# Patient Record
Sex: Female | Born: 1975 | Race: Black or African American | Hispanic: No | Marital: Single | Smoking: Never smoker
Health system: Southern US, Community
[De-identification: ages and names within clinical notes are randomized; demographics above are authoritative.]

## PROBLEM LIST (undated history)

## (undated) DIAGNOSIS — I10 Essential (primary) hypertension: Secondary | ICD-10-CM

## (undated) DIAGNOSIS — F319 Bipolar disorder, unspecified: Secondary | ICD-10-CM

## (undated) DIAGNOSIS — F329 Major depressive disorder, single episode, unspecified: Secondary | ICD-10-CM

## (undated) DIAGNOSIS — E119 Type 2 diabetes mellitus without complications: Secondary | ICD-10-CM

## (undated) DIAGNOSIS — F32A Depression, unspecified: Secondary | ICD-10-CM

## (undated) DIAGNOSIS — K219 Gastro-esophageal reflux disease without esophagitis: Secondary | ICD-10-CM

## (undated) DIAGNOSIS — E785 Hyperlipidemia, unspecified: Secondary | ICD-10-CM

---
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
## 2011-04-14 ENCOUNTER — Inpatient Hospital Stay: Payer: Self-pay | Admitting: Psychiatry

## 2012-08-12 ENCOUNTER — Ambulatory Visit: Payer: Self-pay | Admitting: Emergency Medicine

## 2012-08-12 LAB — HEPATIC FUNCTION PANEL A (ARMC)
Alkaline Phosphatase: 77 U/L (ref 50–136)
Bilirubin, Direct: <0.1 mg/dL (ref 0.00–0.20)
Bilirubin,Total: 0.3 mg/dL (ref 0.2–1.0)
SGPT (ALT): 16 U/L (ref 12–78)

## 2012-08-18 ENCOUNTER — Ambulatory Visit: Payer: Self-pay | Admitting: Emergency Medicine

## 2012-08-18 LAB — HEMOGLOBIN: HGB: 12.1 g/dL (ref 12.0–16.0)

## 2012-08-19 LAB — CBC WITH DIFFERENTIAL/PLATELET
Basophil #: 0 10*3/uL (ref 0.0–0.1)
Basophil %: 0.3 %
HCT: 32.3 % — ABNORMAL LOW (ref 35.0–47.0)
HGB: 10.9 g/dL — ABNORMAL LOW (ref 12.0–16.0)
Lymphocyte #: 1.6 10*3/uL (ref 1.0–3.6)
Lymphocyte %: 16.7 %
MCH: 29.4 pg (ref 26.0–34.0)
MCV: 87 fL (ref 80–100)
Monocyte %: 7.6 %
RBC: 3.71 10*6/uL — ABNORMAL LOW (ref 3.80–5.20)
WBC: 9.5 10*3/uL (ref 3.6–11.0)

## 2012-08-19 LAB — HEMOGLOBIN: HGB: 10.9 g/dL — ABNORMAL LOW (ref 12.0–16.0)

## 2014-10-21 NOTE — Discharge Summary (Signed)
Dates of Admission and Diagnosis:  Date of Admission 18-Aug-2012   Date of Discharge 20-Aug-2012   Admitting Diagnosis acute cholysystitis   Final Diagnosis acute cholysystitis   Discharge Diagnosis 1 same    Chief Complaint/History of Present Illness pt had abdominal pain for a while ultrasound showed stones brought in for surgery   Routine Hem:  18-Feb-14 15:58   Hemoglobin (CBC) 12.1 (Result(s) reported on 18 Aug 2012 at 04:21PM.)  19-Feb-14 05:20   Hemoglobin (CBC)  10.9  WBC (CBC) 9.5  RBC (CBC)  3.71  Hematocrit (CBC)  32.3  Platelet Count (CBC) 152  MCV 87  MCH 29.4  MCHC 33.8  RDW 12.7  Neutrophil % 75.4  Lymphocyte % 16.7  Monocyte % 7.6  Eosinophil % 0.0  Basophil % 0.3  Neutrophil #  7.2  Lymphocyte # 1.6  Monocyte # 0.7  Eosinophil # 0.0  Basophil # 0.0 (Result(s) reported on 19 Aug 2012 at 06:11AM.)    09:19   Hemoglobin (CBC)  10.9 (Result(s) reported on 19 Aug 2012 at 09:43AM.)   Hospital Course:  Hospital Course pt was admitted for observation post op due to bleeding from liver but did very well and discharged on 08/20/12   Condition on Discharge Good   DISCHARGE INSTRUCTIONS HOME MEDS:  Medication Reconciliation: Patient's Home Medications at Discharge:     Medication Instructions  aspirin 325 mg oral tablet  1 tab(s) orally once a day   citalopram 20 mg oral tablet  1 tab(s) orally once a day   claritin 10 mg oral tablet  1 tab(s) orally once a day   lopressor 50 mg oral tablet  0.5 tab(s) orally 2 times a day   necon 0.5/35 35 mcg-0.5 mg oral tablet  1 tab(s) orally once a day   hydrochlorothiazide 25 mg oral tablet  1 tab(s) orally once a day   klor-con m20 20 meq oral tablet, extended release  1 tab(s) orally once a day   clozapine 200 mg oral tablet  1 tab(s) orally once a day (at bedtime)   clozapine 50 mg oral tablet  2 tab(s) orally once a day (in the morning)   pravachol 40 mg oral tablet  1 tab(s) orally once a day (at bedtime)    citalopram 20 mg oral tablet  1 tab(s) orally once a day   metoprolol tartrate 25 mg oral tablet  1 tab(s) orally 2 times a day   acetaminophen-oxycodone 325 mg-5 mg oral tablet  1 tab(s) orally every 4 hours, As needed, pain    PRESCRIPTIONS: PRINTED AND PLACED ON CHART   Physician's Instructions:  Home Health? No   Treatments None   Dressing Care Stitches have been used to close your incision.  Keep this area clean and dry.  Keep the bandage in place unless it becomes loose or soiled.   Home Oxygen? No   Diet Regular   Diet Consistency Regular Consistency   Activity Limitations No exertional activity   Referrals None   Return to Work after follow up visit with MD   Time frame for Follow Up Appointment 5 days   Other Comments measure the drainage daily.   Electronic Signatures: Vella Kohler (MD)  (Signed 20-Feb-14 13:23)  Authored: ADMISSION DATE AND DIAGNOSIS, CHIEF COMPLAINT/HPI, PERTINENT LABS, HOSPITAL COURSE, DISCHARGE INSTRUCTIONS HOME MEDS, PATIENT INSTRUCTIONS   Last Updated: 20-Feb-14 13:23 by Vella Kohler (MD)

## 2014-10-21 NOTE — Op Note (Signed)
PATIENT NAME:  Vanessa Garza, NATZKE MR#:  [id number] DATE OF BIRTH:  10-05-1975  DATE OF PROCEDURE:  08/18/2012  PREOPERATIVE DIAGNOSIS:  Acute cholecystitis.  POSTOPERATIVE DIAGNOSIS:  Acute cholecystitis.  PROCEDURE:  Laparoscopic cholecystectomy.  INDICATION FOR SURGERY: This is a patient who has been having pain in the right upper quadrant of the abdomen. She had acute cholecystitis and she was then brought in for surgery for lap chole today. Her liver functions are within normal limits.  ON THIS ADMISSION, THE PATIENT FINDINGS: 1.  Acutely chronic, very thickened gallbladder.  2.  Very difficult dissection from the liver bed.  3.  Stomach duodenum was stuck to the liver with a big adhesion which caused some bleeding from the site of the liver.   DESCRIPTION OF PROCEDURE: The patient was then brought to surgery. Under general anesthesia, the abdomen was then prepped and draped. A small incision was made above the umbilicus. The patient was very obese and after we went to the fascia, opened the fascia and put a Hasson trocar inside. The patient was so big that all of the trocar was in. After that initial inspection and CO2 was insufflated, the camera was then put in. Another trocar was put in the epigastric region area and another tube was put in the right upper quadrant of the abdomen. In the beginning, the gallbladder was thickened and was hard to grasp, although it was not hydrops but the stone was stuck over the cystic duct area. And since this patient was pretty big, we had a hard time lifting the gallbladder above the liver because of the size of the patient.  So one clamp was applied at the upper end of the gallbladder and the other clamp was applied to the lower end of the gallbladder. Dissection was done laterally to release the gallbladder off the liver. The patient had stomach and the duodenum stuck to the liver edge and there were a lot of adhesions which I released one by one, but the other  big release adhesion when we removed, it took away part of the liver capsule. The patient had a lot of bleeding from there, so we packed it with Surgicel. Multiple packs of Surgicel put in. After that, dissection was done over the cystic duct gallbladder junction very difficult dissection. First of all, the cystic artery was exposed. It was clipped and cut. The duct was exposed. The stone was stuck way at the [facility] pouch area and a space was made between the [facility] pouch and the cystic duct and it was then clipped and cut. The gallbladder was intrahepatic on top of it, so very difficult dissection. I had to put another trocar in the epigastric region; this was lower than the previous one to grab the gallbladder to lift it up. So finally after releasing the gallbladder up from the liver bed, hardly there was any new peritoneum, so there was some oozing from the liver bed also. After we took off the gallbladder, we really could not see any obvious bleeding. It looked like coming from the toned liver. So after packing and waiting and we removed the gallbladder from the upper part of the epigastric region and had very little cystic duct with it. After that irrigation was performed. It seemed to stop the bleeding with pressure due to Surgicel, then I put a drain in and put in the subhepatic space and brought out as a separate stab wound and from where one of the 5 mm trocar _____ #  10 drain out from that area. After again we saw the liver edge which looked okay and there was nothing accumulating on the back of the liver and the drain was in good position, the abdomen was then closed.  The _____ fascia was closed with interrupted 0 Vicryl sutures. Marcaine was injected and then all the areas were sutured with staples. The patient tolerated the procedure and sent to the recovery room in satisfactory condition.    ____________________________ Welford Roche Phylis Bougie, MD msh:ce D: 08/18/2012 15:54:18 ET T: 08/18/2012  16:07:29 ET JOB#: [id number]  cc: Cornellius Kropp S. Phylis Bougie, MD, <Dictator> Sharene Butters MD ELECTRONICALLY SIGNED 08/24/2012 8:12

## 2018-01-29 DIAGNOSIS — Z0000 Encounter for general adult medical examination without abnormal findings: Secondary | ICD-10-CM | POA: Diagnosis not present

## 2018-01-29 DIAGNOSIS — R5383 Other fatigue: Secondary | ICD-10-CM | POA: Diagnosis not present

## 2018-01-29 DIAGNOSIS — I10 Essential (primary) hypertension: Secondary | ICD-10-CM | POA: Diagnosis not present

## 2018-01-29 DIAGNOSIS — R7303 Prediabetes: Secondary | ICD-10-CM | POA: Diagnosis not present

## 2018-01-29 DIAGNOSIS — E559 Vitamin D deficiency, unspecified: Secondary | ICD-10-CM | POA: Diagnosis not present

## 2018-01-29 DIAGNOSIS — Z79899 Other long term (current) drug therapy: Secondary | ICD-10-CM | POA: Diagnosis not present

## 2018-01-29 DIAGNOSIS — Z1389 Encounter for screening for other disorder: Secondary | ICD-10-CM | POA: Diagnosis not present

## 2018-03-04 DIAGNOSIS — Z79899 Other long term (current) drug therapy: Secondary | ICD-10-CM | POA: Diagnosis not present

## 2018-03-04 DIAGNOSIS — R5383 Other fatigue: Secondary | ICD-10-CM | POA: Diagnosis not present

## 2018-03-04 DIAGNOSIS — R7303 Prediabetes: Secondary | ICD-10-CM | POA: Diagnosis not present

## 2018-03-04 DIAGNOSIS — R7989 Other specified abnormal findings of blood chemistry: Secondary | ICD-10-CM | POA: Diagnosis not present

## 2018-03-04 DIAGNOSIS — E7800 Pure hypercholesterolemia, unspecified: Secondary | ICD-10-CM | POA: Diagnosis not present

## 2018-03-04 DIAGNOSIS — N390 Urinary tract infection, site not specified: Secondary | ICD-10-CM | POA: Diagnosis not present

## 2018-03-04 DIAGNOSIS — R300 Dysuria: Secondary | ICD-10-CM | POA: Diagnosis not present

## 2018-03-04 DIAGNOSIS — I10 Essential (primary) hypertension: Secondary | ICD-10-CM | POA: Diagnosis not present

## 2018-03-04 DIAGNOSIS — Z23 Encounter for immunization: Secondary | ICD-10-CM | POA: Diagnosis not present

## 2018-03-04 DIAGNOSIS — E039 Hypothyroidism, unspecified: Secondary | ICD-10-CM | POA: Diagnosis not present

## 2018-03-04 DIAGNOSIS — E559 Vitamin D deficiency, unspecified: Secondary | ICD-10-CM | POA: Diagnosis not present

## 2018-03-04 DIAGNOSIS — Z118 Encounter for screening for other infectious and parasitic diseases: Secondary | ICD-10-CM | POA: Diagnosis not present

## 2018-03-04 DIAGNOSIS — Z112 Encounter for screening for other bacterial diseases: Secondary | ICD-10-CM | POA: Diagnosis not present

## 2018-03-05 DIAGNOSIS — M79661 Pain in right lower leg: Secondary | ICD-10-CM | POA: Diagnosis not present

## 2018-03-05 DIAGNOSIS — G603 Idiopathic progressive neuropathy: Secondary | ICD-10-CM | POA: Diagnosis not present

## 2018-04-07 DIAGNOSIS — L659 Nonscarring hair loss, unspecified: Secondary | ICD-10-CM | POA: Diagnosis not present

## 2018-04-07 DIAGNOSIS — R7989 Other specified abnormal findings of blood chemistry: Secondary | ICD-10-CM | POA: Diagnosis not present

## 2018-04-07 DIAGNOSIS — R221 Localized swelling, mass and lump, neck: Secondary | ICD-10-CM | POA: Diagnosis not present

## 2018-04-07 DIAGNOSIS — R5383 Other fatigue: Secondary | ICD-10-CM | POA: Diagnosis not present

## 2018-04-14 DIAGNOSIS — Z79899 Other long term (current) drug therapy: Secondary | ICD-10-CM | POA: Diagnosis not present

## 2018-04-28 DIAGNOSIS — M79671 Pain in right foot: Secondary | ICD-10-CM | POA: Diagnosis not present

## 2018-04-28 DIAGNOSIS — M79672 Pain in left foot: Secondary | ICD-10-CM | POA: Diagnosis not present

## 2018-05-05 DIAGNOSIS — M79661 Pain in right lower leg: Secondary | ICD-10-CM | POA: Diagnosis not present

## 2018-05-05 DIAGNOSIS — G603 Idiopathic progressive neuropathy: Secondary | ICD-10-CM | POA: Diagnosis not present

## 2018-05-08 ENCOUNTER — Other Ambulatory Visit: Payer: Self-pay

## 2018-05-08 NOTE — Patient Outreach (Signed)
[facility] [facility]) Care Management  05/08/2018  LONITA DEBES [birth year] [id number]   Medication Adherence call to Mrs. Gracelee Stemmler patient's telephone number is disconnected patient is due on Pravastatin 40 mg. Mrs. Zettlemoyer is showing past due under [place].   [place] Management Direct Dial [phone]  Fax [phone] [email or web address]

## 2018-05-26 DIAGNOSIS — Z79899 Other long term (current) drug therapy: Secondary | ICD-10-CM | POA: Diagnosis not present

## 2018-05-27 DIAGNOSIS — R5383 Other fatigue: Secondary | ICD-10-CM | POA: Diagnosis not present

## 2018-05-27 DIAGNOSIS — J069 Acute upper respiratory infection, unspecified: Secondary | ICD-10-CM | POA: Diagnosis not present

## 2018-05-27 DIAGNOSIS — R05 Cough: Secondary | ICD-10-CM | POA: Diagnosis not present

## 2018-06-02 DIAGNOSIS — E119 Type 2 diabetes mellitus without complications: Secondary | ICD-10-CM | POA: Diagnosis not present

## 2018-06-02 DIAGNOSIS — E7800 Pure hypercholesterolemia, unspecified: Secondary | ICD-10-CM | POA: Diagnosis not present

## 2018-06-02 DIAGNOSIS — I10 Essential (primary) hypertension: Secondary | ICD-10-CM | POA: Diagnosis not present

## 2018-06-02 DIAGNOSIS — E559 Vitamin D deficiency, unspecified: Secondary | ICD-10-CM | POA: Diagnosis not present

## 2018-06-02 DIAGNOSIS — R7303 Prediabetes: Secondary | ICD-10-CM | POA: Diagnosis not present

## 2018-06-17 ENCOUNTER — Other Ambulatory Visit: Payer: Self-pay

## 2018-06-17 NOTE — Patient Outreach (Signed)
[facility]) Care Management  06/17/2018  LUDIVINA GUYMON [birth year] [id number]   Medication Adherence call to Mrs. Tarina Volk patient's telephone number is disconnected patient is due on Pravastatin 40, [place] said patient pick up on Dec.12 and deliver to the patient she received a pill pack every month. Mrs. Berland is showing past due under [place].   [place] Management Direct Dial [phone]  Fax [phone] [email or web address]

## 2018-06-18 DIAGNOSIS — R7303 Prediabetes: Secondary | ICD-10-CM | POA: Diagnosis not present

## 2018-06-18 DIAGNOSIS — E876 Hypokalemia: Secondary | ICD-10-CM | POA: Diagnosis not present

## 2018-06-18 DIAGNOSIS — I10 Essential (primary) hypertension: Secondary | ICD-10-CM | POA: Diagnosis not present

## 2018-06-29 DIAGNOSIS — Z79899 Other long term (current) drug therapy: Secondary | ICD-10-CM | POA: Diagnosis not present

## 2018-07-13 DIAGNOSIS — Z79899 Other long term (current) drug therapy: Secondary | ICD-10-CM | POA: Diagnosis not present

## 2018-08-28 DIAGNOSIS — Z1231 Encounter for screening mammogram for malignant neoplasm of breast: Secondary | ICD-10-CM | POA: Diagnosis not present

## 2018-09-15 DIAGNOSIS — E7800 Pure hypercholesterolemia, unspecified: Secondary | ICD-10-CM | POA: Diagnosis not present

## 2018-09-15 DIAGNOSIS — R7303 Prediabetes: Secondary | ICD-10-CM | POA: Diagnosis not present

## 2018-09-15 DIAGNOSIS — E559 Vitamin D deficiency, unspecified: Secondary | ICD-10-CM | POA: Diagnosis not present

## 2018-09-15 DIAGNOSIS — E119 Type 2 diabetes mellitus without complications: Secondary | ICD-10-CM | POA: Diagnosis not present

## 2018-09-15 DIAGNOSIS — I10 Essential (primary) hypertension: Secondary | ICD-10-CM | POA: Diagnosis not present

## 2018-09-17 ENCOUNTER — Other Ambulatory Visit: Payer: Self-pay

## 2018-09-17 NOTE — Patient Outreach (Signed)
[place] [facility]) Care Management  09/17/2018  Vanessa Garza 20-May-1976 [id number]   Medication Adherence call to Vanessa Garza patient is recieving all his medication from [facility] they pill pack and deliver to him, patient is due on Pravastatin 40 mg under [place].    [place] Management Direct Dial [phone]  Fax [phone] [name]

## 2018-10-30 DIAGNOSIS — Z79899 Other long term (current) drug therapy: Secondary | ICD-10-CM | POA: Diagnosis not present

## 2018-11-16 ENCOUNTER — Other Ambulatory Visit: Payer: Self-pay

## 2018-11-16 NOTE — Patient Outreach (Signed)
[place] [facility]) Care Management  11/16/2018  Vanessa Garza [birth year] [id number]   Medication Adherence call to Vanessa Garza patient did not answer patient is due on Metformin 500 mg and Pravastatin 20 mg under [place].   [place] Management Direct Dial [phone]  Fax [phone] [name]

## 2018-11-30 DIAGNOSIS — Z79899 Other long term (current) drug therapy: Secondary | ICD-10-CM | POA: Diagnosis not present

## 2018-12-08 ENCOUNTER — Other Ambulatory Visit: Payer: Self-pay

## 2018-12-08 NOTE — Patient Outreach (Signed)
[place] [facility]) Care Management  12/08/2018  Vanessa Garza [birth year] [id number]   Medication Adherence call to Mrs. Carey Lafon Telephone call to Patient regarding Medication Adherence unable to reach patient. Mrs Osmanovic is showing past due under [place].   [place] Management Direct Dial [phone]  Fax [phone] [email or web address]

## 2018-12-29 DIAGNOSIS — E7800 Pure hypercholesterolemia, unspecified: Secondary | ICD-10-CM | POA: Diagnosis not present

## 2018-12-29 DIAGNOSIS — E119 Type 2 diabetes mellitus without complications: Secondary | ICD-10-CM | POA: Diagnosis not present

## 2018-12-29 DIAGNOSIS — I10 Essential (primary) hypertension: Secondary | ICD-10-CM | POA: Diagnosis not present

## 2018-12-29 DIAGNOSIS — E559 Vitamin D deficiency, unspecified: Secondary | ICD-10-CM | POA: Diagnosis not present

## 2018-12-29 DIAGNOSIS — R221 Localized swelling, mass and lump, neck: Secondary | ICD-10-CM | POA: Diagnosis not present

## 2018-12-29 DIAGNOSIS — K219 Gastro-esophageal reflux disease without esophagitis: Secondary | ICD-10-CM | POA: Diagnosis not present

## 2018-12-29 DIAGNOSIS — N200 Calculus of kidney: Secondary | ICD-10-CM | POA: Diagnosis not present

## 2018-12-30 ENCOUNTER — Other Ambulatory Visit: Payer: Self-pay

## 2018-12-30 NOTE — Patient Outreach (Signed)
[place] [facility] [facility]) Care Management  12/30/2018  Vanessa Garza 1975/10/02 [id number]   Medication Adherence call to Vanessa Garza patient did not answer patient is due on Metformin 500 mg and Pravastatin 40 mg under Vanessa Garza.   [facility]essa Garza Management Direct Dial [phone]  Fax [phone] [email or web address]

## 2019-02-01 ENCOUNTER — Other Ambulatory Visit: Payer: Self-pay

## 2019-02-01 NOTE — Patient Outreach (Signed)
[place] [facility]) Care Management  02/01/2019  Vanessa Garza Mar 26, 1976 [id number]   Medication Adherence call to Vanessa Garza patient has a disconnected number patient is past due under [place].   [place] Management Direct Dial [phone]  Fax [phone] [email or web address]

## 2019-02-14 ENCOUNTER — Encounter: Payer: Self-pay | Admitting: Emergency Medicine

## 2019-02-14 ENCOUNTER — Emergency Department
Admission: EM | Admit: 2019-02-14 | Discharge: 2019-02-14 | Disposition: A | Discharge status: Home / Self Care | Payer: Medicare Other | Attending: Emergency Medicine | Admitting: Emergency Medicine

## 2019-02-14 ENCOUNTER — Other Ambulatory Visit: Payer: Self-pay

## 2019-02-14 DIAGNOSIS — Y9389 Activity, other specified: Secondary | ICD-10-CM | POA: Diagnosis not present

## 2019-02-14 DIAGNOSIS — W5551XA Bitten by raccoon, initial encounter: Secondary | ICD-10-CM | POA: Diagnosis not present

## 2019-02-14 DIAGNOSIS — Z23 Encounter for immunization: Secondary | ICD-10-CM | POA: Insufficient documentation

## 2019-02-14 DIAGNOSIS — E785 Hyperlipidemia, unspecified: Secondary | ICD-10-CM | POA: Insufficient documentation

## 2019-02-14 DIAGNOSIS — S61452A Open bite of left hand, initial encounter: Secondary | ICD-10-CM | POA: Insufficient documentation

## 2019-02-14 DIAGNOSIS — Z2914 Encounter for prophylactic rabies immune globin: Secondary | ICD-10-CM | POA: Insufficient documentation

## 2019-02-14 DIAGNOSIS — Z203 Contact with and (suspected) exposure to rabies: Secondary | ICD-10-CM | POA: Insufficient documentation

## 2019-02-14 DIAGNOSIS — E119 Type 2 diabetes mellitus without complications: Secondary | ICD-10-CM | POA: Insufficient documentation

## 2019-02-14 DIAGNOSIS — Y999 Unspecified external cause status: Secondary | ICD-10-CM | POA: Diagnosis not present

## 2019-02-14 DIAGNOSIS — I10 Essential (primary) hypertension: Secondary | ICD-10-CM | POA: Insufficient documentation

## 2019-02-14 DIAGNOSIS — Y929 Unspecified place or not applicable: Secondary | ICD-10-CM | POA: Insufficient documentation

## 2019-02-14 HISTORY — DX: Bipolar disorder, unspecified: F31.9

## 2019-02-14 HISTORY — DX: Essential (primary) hypertension: I10

## 2019-02-14 HISTORY — DX: Gastro-esophageal reflux disease without esophagitis: K21.9

## 2019-02-14 HISTORY — DX: Hyperlipidemia, unspecified: E78.5

## 2019-02-14 HISTORY — DX: Type 2 diabetes mellitus without complications: E11.9

## 2019-02-14 HISTORY — DX: Depression, unspecified: F32.A

## 2019-02-14 MED ORDER — RABIES IMMUNE GLOBULIN 150 UNIT/ML IM INJ
20.0000 [IU]/kg | INJECTION | Freq: Once | INTRAMUSCULAR | Status: AC
  Administered 2019-02-14: 2400 [IU] via INTRAMUSCULAR
  Filled 2019-02-14: qty 16

## 2019-02-14 MED ORDER — RABIES VACCINE, PCEC IM SUSR
1.0000 mL | Freq: Once | INTRAMUSCULAR | Status: AC
Start: 2019-02-14 — End: 2019-02-14
  Administered 2019-02-14: 1 mL via INTRAMUSCULAR
  Filled 2019-02-14: qty 1

## 2019-02-14 MED ORDER — AMOXICILLIN-POT CLAVULANATE 875-125 MG PO TABS: 1.0000 | ORAL_TABLET | Freq: Two times a day (BID) | ORAL | 0 refills | Status: DC

## 2019-02-14 MED ORDER — TETANUS-DIPHTH-ACELL PERTUSSIS 5-2.5-18.5 LF-MCG/0.5 IM SUSP
0.5000 mL | Freq: Once | INTRAMUSCULAR | Status: AC
  Administered 2019-02-14: 0.5 mL via INTRAMUSCULAR
  Filled 2019-02-14: qty 0.5

## 2019-02-14 NOTE — ED Notes (Signed)
Pt states raccoon tried to bite her and has scratches/ bite marks on her left hand. Also needs a tetanus update.

## 2019-02-14 NOTE — ED Notes (Signed)
Pt states that she was bitten by a racoon on the left hand. Pt is in NAD.

## 2019-02-14 NOTE — ED Provider Notes (Signed)
[facility] Emergency Department Provider Note  ____________________________________________  Time seen: Approximately 9:31 PM  I have reviewed the triage vital signs and the nursing notes.   HISTORY  Chief Complaint Animal Bite    HPI Vanessa Garza is a 43 y.o. female who presents the emergency department complaining of a raccoon bite to the  left hand.  Patient reports that she picked up a wild raccoon and it bit her.  Patient was in a group home and is accompanied by the caregiver.  They did not have the raccoon in custody.  Patient is out of date for her tetanus shot.  Other than raccoon bite to the left hand, no other complaints.        Past Medical History:  Diagnosis Date  . Bipolar 1 disorder ([place])   . Depression   . Diabetes mellitus without complication ([place])   . GERD (gastroesophageal reflux disease)   . Hyperlipemia   . Hypertension     There are no active problems to display for this patient.   History reviewed. No pertinent surgical history.  Prior to Admission medications   Medication Sig Start Date End Date Taking? Authorizing Provider  amoxicillin-clavulanate (AUGMENTIN) 875-125 MG tablet Take 1 tablet by mouth 2 (two) times daily. 02/14/19   Cuthriell, Charline Bills, PA-C    Allergies Patient has no known allergies.  No family history on file.  Social History Social History   Tobacco Use  . Smoking status: Never Smoker  . Smokeless tobacco: Never Used  Substance Use Topics  . Alcohol use: Not Currently  . Drug use: Not Currently     Review of Systems  Constitutional: No fever/chills Eyes: No visual changes. No discharge ENT: No upper respiratory complaints. Cardiovascular: no chest pain. Respiratory: no cough. No SOB. Gastrointestinal: No abdominal pain.  No nausea, no vomiting.  Musculoskeletal: Raccoon bite to the left hand Skin: Negative for rash, abrasions, lacerations, ecchymosis. Neurological: Negative for  headaches, focal weakness or numbness. 10-point ROS otherwise negative.  ____________________________________________   PHYSICAL EXAM:  VITAL SIGNS: ED Triage Vitals  Enc Vitals Group     BP 02/14/19 1850 (!) 144/96     Pulse Rate 02/14/19 1850 (!) 103     Resp 02/14/19 1850 16     Temp 02/14/19 1850 99.1 F (37.3 C)     Temp Source 02/14/19 1850 Oral     SpO2 02/14/19 1850 100 %     Weight --      Height --      Head Circumference --      Peak Flow --      Pain Score 02/14/19 1847 0     Pain Loc --      Pain Edu? --      Excl. in [place]? --      Constitutional: Alert and oriented. Well appearing and in no acute distress. Eyes: Conjunctivae are normal. PERRL. EOMI. Head: Atraumatic. Neck: No stridor.    Cardiovascular: Normal rate, regular rhythm. Normal S1 and S2.  Good peripheral circulation. Respiratory: Normal respiratory effort without tachypnea or retractions. Lungs CTAB. Good air entry to the bases with no decreased or absent breath sounds. Musculoskeletal: Full range of motion to all extremities. No gross deformities appreciated.  Visualization of the left hand reveals multiple punctate lesions to the dorsal and palmar aspect of the hand between the second and third digit.  No active bleeding.  No visible foreign body.  Full range of motion all  digits.  Sensation and capillary refill intact all digits. Neurologic:  Normal speech and language. No gross focal neurologic deficits are appreciated.  Skin:  Skin is warm, dry and intact. No rash noted. Psychiatric: Mood and affect are normal. Speech and behavior are normal. Patient exhibits appropriate insight and judgement.   ____________________________________________   LABS (all labs ordered are listed, but only abnormal results are displayed)  Labs Reviewed - No data to display ____________________________________________  EKG   ____________________________________________  RADIOLOGY   No results  found.  ____________________________________________    PROCEDURES  Procedure(s) performed:    Procedures    Medications  rabies vaccine (RABAVERT) injection 1 mL (1 mL Intramuscular Given 02/14/19 2226)  rabies immune globulin (HYPERAB/KEDRAB) injection 2,400 Units (2,400 Units Intramuscular Given 02/14/19 2231)  Tdap (BOOSTRIX) injection 0.5 mL (0.5 mLs Intramuscular Given 02/14/19 2225)     ____________________________________________   INITIAL IMPRESSION / ASSESSMENT AND PLAN / ED COURSE  Pertinent labs & imaging results that were available during my care of the patient were reviewed by me and considered in my medical decision making (see chart for details).  Review of the  CSRS was performed in accordance of the [place] prior to dispensing any controlled drugs.           Patient's diagnosis is consistent with animal bite to the left hand.  Patient presented to the emergency department with caregiver for complaint of raccoon bite to the left hand.  Patient has small superficial punctate lesions consistent with animal bite to the left hand.  No active bleeding or foreign body.  Full range of motion to all digits.  Given the nature of injury with bite by a raccoon, patient will be started on the rabies series.  Rabies vaccine and immunoglobin administered today.  Tetanus shot updated patient will be placed on antibiotics.  Follow-up in 3 days for repeat rabies vaccine. Patient is given ED precautions to return to the ED for any worsening or new symptoms.     ____________________________________________  FINAL CLINICAL IMPRESSION(S) / ED DIAGNOSES  Final diagnoses:  Raccoon bite, initial encounter  Need for rabies vaccination      NEW MEDICATIONS STARTED DURING THIS VISIT:  ED Discharge Orders         Ordered    amoxicillin-clavulanate (AUGMENTIN) 875-125 MG tablet  2 times daily     02/14/19 2235              This chart was dictated using voice  recognition software/Dragon. Despite best efforts to proofread, errors can occur which can change the meaning. Any change was purely unintentional.    Darletta Moll, PA-C 02/14/19 2235    Earleen Newport, MD 02/14/19 [phone]

## 2019-02-14 NOTE — ED Triage Notes (Signed)
Pt to ED via POV with caregiver for racoon bite to the right hand. Pt is in NAD. No bleeding noted.

## 2019-02-17 ENCOUNTER — Other Ambulatory Visit: Payer: Self-pay

## 2019-02-17 ENCOUNTER — Ambulatory Visit
Admission: EM | Admit: 2019-02-17 | Discharge: 2019-02-17 | Disposition: A | Discharge status: Home / Self Care | Payer: Medicare Other | Attending: Family Medicine | Admitting: Family Medicine

## 2019-02-17 DIAGNOSIS — Z203 Contact with and (suspected) exposure to rabies: Secondary | ICD-10-CM

## 2019-02-17 MED ORDER — RABIES VACCINE, PCEC IM SUSR
1.0000 mL | Freq: Once | INTRAMUSCULAR | Status: AC
  Administered 2019-02-17: 16:00:00 1 mL via INTRAMUSCULAR

## 2019-02-17 NOTE — Discharge Instructions (Signed)
Please Return for Day 7 Rabies Vaccine on August 23rd. We are open 8am-4pm on Sunday.

## 2019-02-17 NOTE — ED Triage Notes (Signed)
Patient is here for Day 3 rabies vaccine. Initial vaccines given in [place] ED. No issues with previous injections.

## 2019-02-22 ENCOUNTER — Ambulatory Visit
Admission: EM | Admit: 2019-02-22 | Discharge: 2019-02-22 | Disposition: A | Discharge status: Home / Self Care | Payer: Medicare Other | Attending: Family Medicine | Admitting: Family Medicine

## 2019-02-22 ENCOUNTER — Other Ambulatory Visit: Payer: Self-pay

## 2019-02-22 DIAGNOSIS — Z203 Contact with and (suspected) exposure to rabies: Secondary | ICD-10-CM | POA: Diagnosis not present

## 2019-02-22 MED ORDER — RABIES VACCINE, PCEC IM SUSR
1.0000 mL | Freq: Once | INTRAMUSCULAR | Status: AC
  Administered 2019-02-22: 09:00:00 1 mL via INTRAMUSCULAR

## 2019-02-22 NOTE — ED Triage Notes (Signed)
Patient is here for Day 7 Rabies vaccine (1 day late). Tolerated injection well last time.

## 2019-02-22 NOTE — Discharge Instructions (Signed)
Patient is to return on Monday 03/01/2019 for Day 14 of Rabies Vaccine. We are open 8am-8pm on this day.

## 2019-03-01 ENCOUNTER — Ambulatory Visit
Admission: EM | Admit: 2019-03-01 | Discharge: 2019-03-01 | Disposition: A | Discharge status: Home / Self Care | Payer: Medicare Other | Attending: Family Medicine | Admitting: Family Medicine

## 2019-03-01 DIAGNOSIS — Z203 Contact with and (suspected) exposure to rabies: Secondary | ICD-10-CM

## 2019-03-01 MED ORDER — RABIES VACCINE, PCEC IM SUSR
1.0000 mL | Freq: Once | INTRAMUSCULAR | Status: AC
  Administered 2019-03-01: 1 mL via INTRAMUSCULAR

## 2019-03-01 NOTE — ED Triage Notes (Signed)
Pt here for her Day 14 rabies vaccine. Tolerated well

## 2019-03-01 NOTE — ED Notes (Signed)
After obtaining informed consent, the immunization is given by Carmelina Paddock, CMA.  L deltoid. Pt tolerated well.

## 2019-03-02 ENCOUNTER — Other Ambulatory Visit: Payer: Self-pay

## 2019-03-02 NOTE — Patient Outreach (Signed)
[place] [facility]) Care Management  03/02/2019  Vanessa Garza Mar 13, 1976 [id number]   Medication Adherence call to Vanessa Garza  Patients telephone number belongs to someone else person provide with a different number [phone] but is disconnected patient is showing past due on Pravastatin 40 mg and Metformin 500 mg under [name].   [place] Management Direct Dial [phone]  Fax [phone] Dandy [email or web address]

## 2019-04-05 ENCOUNTER — Other Ambulatory Visit: Payer: Self-pay

## 2019-04-05 DIAGNOSIS — E7800 Pure hypercholesterolemia, unspecified: Secondary | ICD-10-CM | POA: Diagnosis not present

## 2019-04-05 DIAGNOSIS — I10 Essential (primary) hypertension: Secondary | ICD-10-CM | POA: Diagnosis not present

## 2019-04-05 DIAGNOSIS — Z23 Encounter for immunization: Secondary | ICD-10-CM | POA: Diagnosis not present

## 2019-04-05 DIAGNOSIS — E785 Hyperlipidemia, unspecified: Secondary | ICD-10-CM | POA: Diagnosis not present

## 2019-04-05 DIAGNOSIS — Z0000 Encounter for general adult medical examination without abnormal findings: Secondary | ICD-10-CM | POA: Diagnosis not present

## 2019-04-05 DIAGNOSIS — E559 Vitamin D deficiency, unspecified: Secondary | ICD-10-CM | POA: Diagnosis not present

## 2019-04-05 DIAGNOSIS — Z1389 Encounter for screening for other disorder: Secondary | ICD-10-CM | POA: Diagnosis not present

## 2019-04-05 DIAGNOSIS — Z8249 Family history of ischemic heart disease and other diseases of the circulatory system: Secondary | ICD-10-CM | POA: Diagnosis not present

## 2019-04-05 DIAGNOSIS — I2510 Atherosclerotic heart disease of native coronary artery without angina pectoris: Secondary | ICD-10-CM | POA: Diagnosis not present

## 2019-04-05 DIAGNOSIS — D649 Anemia, unspecified: Secondary | ICD-10-CM | POA: Diagnosis not present

## 2019-04-05 DIAGNOSIS — E119 Type 2 diabetes mellitus without complications: Secondary | ICD-10-CM | POA: Diagnosis not present

## 2019-04-05 DIAGNOSIS — R7989 Other specified abnormal findings of blood chemistry: Secondary | ICD-10-CM | POA: Diagnosis not present

## 2019-04-05 NOTE — Patient Outreach (Signed)
[facility] [facility]) Care Management  04/05/2019  [name] 11-08-1975 [id number]   Medication Adherence call to Mrs. Vanessa Garza HIPPA Compliant Voice message left with a call back number. [name] is showing past due on Pravastatin 40 mg and Metformin 500 mg under [place].   Vanessa Garza [phone]  Fax [phone] [name]

## 2019-05-10 ENCOUNTER — Other Ambulatory Visit: Payer: Self-pay

## 2019-05-10 NOTE — Patient Outreach (Signed)
[place] [facility]) Care Management  05/10/2019  JESSIECA ZEMAN April 24, 1976 [id number]   Medication Adherence call to Mrs. Deshundra Formby HIPPA Compliant Voice message left with a call back number. Mrs. Riveron is showing past due on Pravastatin 40 mg and Metformin 50 mg under [place].   [place] Management Direct Dial [phone]  Fax [phone] [email or web address] [email or web address]

## 2019-06-01 ENCOUNTER — Other Ambulatory Visit: Payer: Self-pay

## 2019-06-01 NOTE — Patient Outreach (Signed)
[place] Care Management  06/01/2019  Vanessa Garza [birth year] [id number]   Medication Adherence call to Vanessa Garza HIPPA Compliant Voice message left with a call back number. Vanessa Garza is showing past due on Pravastatin 40 mg and Metformin 500 mg under [place].   [place] Management Direct Dial [phone]  Fax [phone] Vanessa [email or web address]

## 2019-06-29 DIAGNOSIS — E119 Type 2 diabetes mellitus without complications: Secondary | ICD-10-CM | POA: Diagnosis not present

## 2019-06-29 DIAGNOSIS — I6529 Occlusion and stenosis of unspecified carotid artery: Secondary | ICD-10-CM | POA: Diagnosis not present

## 2019-06-29 DIAGNOSIS — E876 Hypokalemia: Secondary | ICD-10-CM | POA: Diagnosis not present

## 2019-06-29 DIAGNOSIS — I361 Nonrheumatic tricuspid (valve) insufficiency: Secondary | ICD-10-CM | POA: Diagnosis not present

## 2019-07-08 DIAGNOSIS — E039 Hypothyroidism, unspecified: Secondary | ICD-10-CM | POA: Diagnosis not present

## 2019-07-08 DIAGNOSIS — E119 Type 2 diabetes mellitus without complications: Secondary | ICD-10-CM | POA: Diagnosis not present

## 2019-07-08 DIAGNOSIS — E118 Type 2 diabetes mellitus with unspecified complications: Secondary | ICD-10-CM | POA: Diagnosis not present

## 2019-07-08 DIAGNOSIS — E876 Hypokalemia: Secondary | ICD-10-CM | POA: Diagnosis not present

## 2019-07-08 DIAGNOSIS — K219 Gastro-esophageal reflux disease without esophagitis: Secondary | ICD-10-CM | POA: Diagnosis not present

## 2019-07-08 DIAGNOSIS — I10 Essential (primary) hypertension: Secondary | ICD-10-CM | POA: Diagnosis not present

## 2019-07-08 DIAGNOSIS — E7800 Pure hypercholesterolemia, unspecified: Secondary | ICD-10-CM | POA: Diagnosis not present

## 2019-08-02 ENCOUNTER — Other Ambulatory Visit: Payer: Self-pay

## 2019-08-02 NOTE — Patient Outreach (Signed)
[place] [facility]) Care Management  08/02/2019  Vanessa Garza 11-20-1975 [id number]   Medication Adherence call to [name] patient is showing past due on Metformin 500 mg,the  Telephone number listed here belongs to someone else the person that answer gave a different number [phone]  to get in touch with patient left a message for patient to call back. [name] is showing past due on Metformin 500 mg under [name].   [place] Management Direct Dial [phone]  Fax [phone] Seydina [email or web address]

## 2019-08-31 ENCOUNTER — Other Ambulatory Visit: Payer: Self-pay

## 2019-08-31 NOTE — Patient Outreach (Signed)
[place] [facility]) Care Management  08/31/2019  Vanessa Garza Feb 07, 1976 [id number]   Medication Adherence call to Vanessa Garza HIPPA Compliant Voice message left with a call back number. Vanessa Garza is showing past due on Pravastatin 40 mg under [place].   [place] Management Direct Dial [phone]  Fax [phone] Vanessa [email or web address]

## 2019-09-07 DIAGNOSIS — E119 Type 2 diabetes mellitus without complications: Secondary | ICD-10-CM | POA: Diagnosis not present

## 2019-09-07 DIAGNOSIS — Z79899 Other long term (current) drug therapy: Secondary | ICD-10-CM | POA: Diagnosis not present

## 2019-09-07 DIAGNOSIS — L84 Corns and callosities: Secondary | ICD-10-CM | POA: Diagnosis not present

## 2019-09-07 DIAGNOSIS — I10 Essential (primary) hypertension: Secondary | ICD-10-CM | POA: Diagnosis not present

## 2019-09-16 DIAGNOSIS — M79661 Pain in right lower leg: Secondary | ICD-10-CM | POA: Diagnosis not present

## 2019-09-16 DIAGNOSIS — G603 Idiopathic progressive neuropathy: Secondary | ICD-10-CM | POA: Diagnosis not present

## 2019-09-23 ENCOUNTER — Ambulatory Visit: Payer: Medicare Other | Admitting: Podiatry

## 2019-10-11 DIAGNOSIS — R9431 Abnormal electrocardiogram [ECG] [EKG]: Secondary | ICD-10-CM | POA: Diagnosis not present

## 2019-10-11 DIAGNOSIS — E876 Hypokalemia: Secondary | ICD-10-CM | POA: Diagnosis not present

## 2019-10-11 DIAGNOSIS — Z118 Encounter for screening for other infectious and parasitic diseases: Secondary | ICD-10-CM | POA: Diagnosis not present

## 2019-10-11 DIAGNOSIS — Z79899 Other long term (current) drug therapy: Secondary | ICD-10-CM | POA: Diagnosis not present

## 2019-10-11 DIAGNOSIS — E7800 Pure hypercholesterolemia, unspecified: Secondary | ICD-10-CM | POA: Diagnosis not present

## 2019-10-11 DIAGNOSIS — Z112 Encounter for screening for other bacterial diseases: Secondary | ICD-10-CM | POA: Diagnosis not present

## 2019-10-11 DIAGNOSIS — R5383 Other fatigue: Secondary | ICD-10-CM | POA: Diagnosis not present

## 2019-10-11 DIAGNOSIS — E785 Hyperlipidemia, unspecified: Secondary | ICD-10-CM | POA: Diagnosis not present

## 2019-10-11 DIAGNOSIS — R300 Dysuria: Secondary | ICD-10-CM | POA: Diagnosis not present

## 2019-10-11 DIAGNOSIS — E119 Type 2 diabetes mellitus without complications: Secondary | ICD-10-CM | POA: Diagnosis not present

## 2019-10-11 DIAGNOSIS — I10 Essential (primary) hypertension: Secondary | ICD-10-CM | POA: Diagnosis not present

## 2019-10-11 DIAGNOSIS — N390 Urinary tract infection, site not specified: Secondary | ICD-10-CM | POA: Diagnosis not present

## 2019-10-11 DIAGNOSIS — R7303 Prediabetes: Secondary | ICD-10-CM | POA: Diagnosis not present

## 2019-10-11 DIAGNOSIS — E559 Vitamin D deficiency, unspecified: Secondary | ICD-10-CM | POA: Diagnosis not present

## 2019-11-24 ENCOUNTER — Observation Stay
Admission: EM | Admit: 2019-11-24 | Discharge: 2019-11-25 | Disposition: A | Discharge status: Home / Self Care | Payer: Medicare Other | Attending: Internal Medicine | Admitting: Internal Medicine

## 2019-11-24 ENCOUNTER — Emergency Department: Payer: Medicare Other

## 2019-11-24 ENCOUNTER — Encounter: Payer: Self-pay | Admitting: Emergency Medicine

## 2019-11-24 ENCOUNTER — Other Ambulatory Visit: Payer: Self-pay

## 2019-11-24 ENCOUNTER — Observation Stay: Payer: Medicare Other

## 2019-11-24 DIAGNOSIS — Z03818 Encounter for observation for suspected exposure to other biological agents ruled out: Secondary | ICD-10-CM | POA: Diagnosis not present

## 2019-11-24 DIAGNOSIS — E876 Hypokalemia: Secondary | ICD-10-CM | POA: Diagnosis not present

## 2019-11-24 DIAGNOSIS — R569 Unspecified convulsions: Principal | ICD-10-CM

## 2019-11-24 DIAGNOSIS — G92 Toxic encephalopathy: Secondary | ICD-10-CM | POA: Insufficient documentation

## 2019-11-24 DIAGNOSIS — R404 Transient alteration of awareness: Secondary | ICD-10-CM | POA: Diagnosis not present

## 2019-11-24 DIAGNOSIS — E119 Type 2 diabetes mellitus without complications: Secondary | ICD-10-CM | POA: Diagnosis not present

## 2019-11-24 DIAGNOSIS — E785 Hyperlipidemia, unspecified: Secondary | ICD-10-CM | POA: Diagnosis not present

## 2019-11-24 DIAGNOSIS — Z7984 Long term (current) use of oral hypoglycemic drugs: Secondary | ICD-10-CM | POA: Insufficient documentation

## 2019-11-24 DIAGNOSIS — F319 Bipolar disorder, unspecified: Secondary | ICD-10-CM | POA: Diagnosis not present

## 2019-11-24 DIAGNOSIS — R0689 Other abnormalities of breathing: Secondary | ICD-10-CM | POA: Diagnosis not present

## 2019-11-24 DIAGNOSIS — I10 Essential (primary) hypertension: Secondary | ICD-10-CM | POA: Insufficient documentation

## 2019-11-24 DIAGNOSIS — R9431 Abnormal electrocardiogram [ECG] [EKG]: Secondary | ICD-10-CM | POA: Diagnosis not present

## 2019-11-24 DIAGNOSIS — G9340 Encephalopathy, unspecified: Secondary | ICD-10-CM | POA: Diagnosis not present

## 2019-11-24 DIAGNOSIS — G928 Other toxic encephalopathy: Secondary | ICD-10-CM | POA: Diagnosis present

## 2019-11-24 DIAGNOSIS — Z79899 Other long term (current) drug therapy: Secondary | ICD-10-CM | POA: Insufficient documentation

## 2019-11-24 DIAGNOSIS — E669 Obesity, unspecified: Secondary | ICD-10-CM | POA: Insufficient documentation

## 2019-11-24 DIAGNOSIS — K219 Gastro-esophageal reflux disease without esophagitis: Secondary | ICD-10-CM | POA: Diagnosis not present

## 2019-11-24 DIAGNOSIS — R0902 Hypoxemia: Secondary | ICD-10-CM | POA: Diagnosis not present

## 2019-11-24 DIAGNOSIS — Z20822 Contact with and (suspected) exposure to covid-19: Secondary | ICD-10-CM | POA: Insufficient documentation

## 2019-11-24 DIAGNOSIS — Z6836 Body mass index (BMI) 36.0-36.9, adult: Secondary | ICD-10-CM | POA: Insufficient documentation

## 2019-11-24 LAB — CBC
HCT: 38.5 % (ref 36.0–46.0)
Hemoglobin: 12.9 g/dL (ref 12.0–15.0)
MCH: 29.5 pg (ref 26.0–34.0)
MCHC: 33.5 g/dL (ref 30.0–36.0)
MCV: 87.9 fL (ref 80.0–100.0)
Platelets: 179 10*3/uL (ref 150–400)
RBC: 4.38 MIL/uL (ref 3.87–5.11)
RDW: 13.1 % (ref 11.5–15.5)
WBC: 10.7 10*3/uL — ABNORMAL HIGH (ref 4.0–10.5)
nRBC: 0 % (ref 0.0–0.2)

## 2019-11-24 LAB — COMPREHENSIVE METABOLIC PANEL
ALT: 13 U/L (ref 0–44)
AST: 24 U/L (ref 15–41)
Albumin: 4.1 g/dL (ref 3.5–5.0)
Alkaline Phosphatase: 58 U/L (ref 38–126)
Anion gap: 10 (ref 5–15)
BUN: 10 mg/dL (ref 6–20)
CO2: 26 mmol/L (ref 22–32)
Calcium: 9.2 mg/dL (ref 8.9–10.3)
Chloride: 104 mmol/L (ref 98–111)
Creatinine, Ser: 1.08 mg/dL — ABNORMAL HIGH (ref 0.44–1.00)
GFR calc Af Amer: >60 mL/min (ref >60)
GFR calc non Af Amer: >60 mL/min (ref >60)
Glucose, Bld: 116 mg/dL — ABNORMAL HIGH (ref 70–99)
Potassium: 3.4 mmol/L — ABNORMAL LOW (ref 3.5–5.1)
Sodium: 140 mmol/L (ref 135–145)
Total Bilirubin: 0.7 mg/dL (ref 0.3–1.2)
Total Protein: 7.6 g/dL (ref 6.5–8.1)

## 2019-11-24 LAB — HIV ANTIBODY (ROUTINE TESTING W REFLEX): HIV Screen 4th Generation wRfx: NONREACTIVE

## 2019-11-24 LAB — PHOSPHORUS: Phosphorus: 3.3 mg/dL (ref 2.5–4.6)

## 2019-11-24 LAB — GLUCOSE, CAPILLARY
Glucose-Capillary: 105 mg/dL — ABNORMAL HIGH (ref 70–99)
Glucose-Capillary: 90 mg/dL (ref 70–99)
Glucose-Capillary: 74 mg/dL (ref 70–99)

## 2019-11-24 LAB — CK: Total CK: 135 U/L (ref 38–234)

## 2019-11-24 LAB — SARS CORONAVIRUS 2 BY RT PCR (HOSPITAL ORDER, PERFORMED IN ~~LOC~~ HOSPITAL LAB): SARS Coronavirus 2: NEGATIVE

## 2019-11-24 LAB — MAGNESIUM: Magnesium: 2.1 mg/dL (ref 1.7–2.4)

## 2019-11-24 MED ORDER — GADOBUTROL 1 MMOL/ML IV SOLN
10.0000 mL | Freq: Once | INTRAVENOUS | Status: AC | PRN
  Administered 2019-11-24: 10 mL via INTRAVENOUS

## 2019-11-24 MED ORDER — ACETAMINOPHEN 325 MG PO TABS: 650.0000 mg | ORAL_TABLET | Freq: Four times a day (QID) | ORAL | Status: DC | PRN

## 2019-11-24 MED ORDER — LORAZEPAM 2 MG/ML IJ SOLN: 1.0000 mg | INTRAMUSCULAR | Status: DC | PRN

## 2019-11-24 MED ORDER — ACETAMINOPHEN 650 MG RE SUPP: 650.0000 mg | Freq: Four times a day (QID) | RECTAL | Status: DC | PRN

## 2019-11-24 MED ORDER — SODIUM CHLORIDE 0.9% FLUSH: 3.0000 mL | Freq: Once | INTRAVENOUS | Status: DC

## 2019-11-24 MED ORDER — ENOXAPARIN SODIUM 40 MG/0.4ML ~~LOC~~ SOLN: 40.0000 mg | SUBCUTANEOUS | Status: DC

## 2019-11-24 MED ORDER — METOPROLOL TARTRATE 25 MG PO TABS
25.0000 mg | ORAL_TABLET | Freq: Two times a day (BID) | ORAL | Status: DC
  Administered 2019-11-25 (×2): 25 mg via ORAL
  Filled 2019-11-24 (×2): qty 1

## 2019-11-24 MED ORDER — INSULIN ASPART 100 UNIT/ML ~~LOC~~ SOLN
0.0000 [IU] | SUBCUTANEOUS | Status: DC
  Administered 2019-11-25: 1 [IU] via SUBCUTANEOUS
  Filled 2019-11-24: qty 1

## 2019-11-24 MED ORDER — POTASSIUM CHLORIDE CRYS ER 20 MEQ PO TBCR
20.0000 meq | EXTENDED_RELEASE_TABLET | Freq: Two times a day (BID) | ORAL | Status: DC
  Administered 2019-11-25 (×2): 20 meq via ORAL
  Filled 2019-11-24 (×2): qty 1

## 2019-11-24 NOTE — ED Provider Notes (Addendum)
[facility] Emergency Department Provider Note  ____________________________________________   First MD Initiated Contact with Patient 11/24/19 1518     (approximate)  I have reviewed the triage vital signs and the nursing notes.   HISTORY  Chief Complaint Seizures    HPI Vanessa Garza is a 44 y.o. female with past medical history as below including bipolar disorder, hypertension, hyperlipidemia, diabetes, here with witnessed seizure-like activity and altered mental status.  Per report from the patient's caregiver at her group home, she was found today in the bathroom with generalized tonic-clonic shaking and seizure-like activity, with her eyes rolled back and her mouth foaming.  She had reportedly been "out of it" this morning with decreased appetite and had been sleeping more than usual today.  She did not respond when they came to check on her in the later afternoon so they went into the bathroom and found her having a seizure-like activity.  She did the breakfast.  She denies any current complaints, though is drowsy and per caregiver, is significantly less responsive than usual.  No other recent medication changes.     On my interview, pt is somewhat delayed but denies any complaints. Specifically, no HA.  Past Medical History:  Diagnosis Date  . Bipolar 1 disorder ([place])   . Depression   . Diabetes mellitus without complication ([place])   . GERD (gastroesophageal reflux disease)   . Hyperlipemia   . Hypertension     Patient Active Problem List   Diagnosis Date Noted  . Seizures ([place]) 11/24/2019  . Type 2 diabetes mellitus without complication ([place]) 99991111  . Toxic metabolic encephalopathy 99991111  . Essential hypertension 11/24/2019  . Hypokalemia 11/24/2019  . Bipolar disorder ([place]) 11/24/2019    History reviewed. No pertinent surgical history.  Prior to Admission medications   Medication Sig Start Date End Date Taking? Authorizing  Provider  citalopram (CELEXA) 20 MG tablet Take 20 mg by mouth daily. 10/25/19   [provider]  cloZAPine (CLOZARIL) 100 MG tablet Take 450 mg by mouth daily. 10/25/19   [provider]  FARXIGA 5 MG TABS tablet Take 5 mg by mouth daily. 10/25/19   [provider]  hydrochlorothiazide (HYDRODIURIL) 25 MG tablet Take 25 mg by mouth daily. 10/25/19   [provider]  metFORMIN (GLUCOPHAGE) 500 MG tablet Take 500 mg by mouth 2 (two) times daily. 10/25/19   [provider]  metoprolol tartrate (LOPRESSOR) 25 MG tablet Take 25 mg by mouth 2 (two) times daily. 10/25/19   [provider]  pantoprazole (PROTONIX) 40 MG tablet Take 40 mg by mouth daily. 10/25/19   [provider]  potassium chloride SA (KLOR-CON) 20 MEQ tablet Take 20 mEq by mouth 2 (two) times daily. 10/25/19   [provider]  pravastatin (PRAVACHOL) 40 MG tablet Take 40 mg by mouth daily. 10/28/19   [provider]    Allergies Patient has no known allergies.  Family History  Family history unknown: Yes    Social History Social History   Tobacco Use  . Smoking status: Never Smoker  . Smokeless tobacco: Never Used  Substance Use Topics  . Alcohol use: Not Currently  . Drug use: Not Currently    Review of Systems  Review of Systems  Unable to perform ROS: Mental status change  Neurological: Positive for seizures.  All other systems reviewed and are negative.    ____________________________________________  PHYSICAL EXAM:      VITAL SIGNS: ED  Triage Vitals  Enc Vitals Group     BP 11/24/19 1048 106/73     Pulse Rate 11/24/19 1048 92     Resp 11/24/19 1048 14     Temp 11/24/19 1048 98.5 F (36.9 C)     Temp Source 11/24/19 1048 Oral     SpO2 11/24/19 1048 99 %     Weight 11/24/19 1045 240 lb (108.9 kg)     Height 11/24/19 1045 5\' 8"  (1.727 m)     Head Circumference --      Peak Flow --      Pain Score 11/24/19 1045 0     Pain Loc  --      Pain Edu? --      Excl. in [place]? --      Physical Exam Vitals and nursing note reviewed.  Constitutional:      General: She is not in acute distress.    Appearance: She is well-developed.  HENT:     Head: Normocephalic and atraumatic.     Mouth/Throat:     Comments: Superficial abrasion to right lateral tongue Eyes:     Conjunctiva/sclera: Conjunctivae normal.  Cardiovascular:     Rate and Rhythm: Normal rate and regular rhythm.     Heart sounds: Normal heart sounds. No murmur. No friction rub.  Pulmonary:     Effort: Pulmonary effort is normal. No respiratory distress.     Breath sounds: Normal breath sounds. No wheezing or rales.  Abdominal:     General: Abdomen is flat. There is no distension.     Palpations: Abdomen is soft.     Tenderness: There is no abdominal tenderness.  Musculoskeletal:     Cervical back: Neck supple.  Skin:    General: Skin is warm.     Capillary Refill: Capillary refill takes less than 2 seconds.  Neurological:     Mental Status: She is alert.     Motor: No abnormal muscle tone.     Comments: Answers questions but not appropriately, and only intermittently follows commands. MAE with at least antigravity strength. Withdraws to stimuli b/l UE and LE.       ____________________________________________   LABS (all labs ordered are listed, but only abnormal results are displayed)  Labs Reviewed  COMPREHENSIVE METABOLIC PANEL - Abnormal; Notable for the following components:      Result Value   Potassium 3.4 (*)    Glucose, Bld 116 (*)    Creatinine, Ser 1.08 (*)    All other components within normal limits  CBC - Abnormal; Notable for the following components:   WBC 10.7 (*)    All other components within normal limits  GLUCOSE, CAPILLARY - Abnormal; Notable for the following components:   Glucose-Capillary 105 (*)    All other components within normal limits  SARS CORONAVIRUS 2 BY RT PCR (HOSPITAL ORDER, [place] [facility])  GLUCOSE, CAPILLARY  MAGNESIUM  BLOOD GAS, VENOUS  URINALYSIS, COMPLETE (UACMP) WITH MICROSCOPIC  URINE DRUG SCREEN, QUALITATIVE ([facility] ONLY)  HIV ANTIBODY (ROUTINE TESTING W REFLEX)  BASIC METABOLIC PANEL  CBC  CK  PHOSPHORUS  CBG MONITORING, ED  CBG MONITORING, ED  POC URINE PREG, ED    ____________________________________________  EKG: Normal sinus rhythm, ventricular rate 90.  QRS 86, QTc 512.  No acute ST elevations or depressions. ________________________________________  RADIOLOGY All imaging, including plain films, CT scans, and ultrasounds, independently reviewed by me, and interpretations confirmed via formal radiology reads.  ED  MD interpretation:   CT head: Negative  Official radiology report(s): EEG  Result Date: 11/24/2019 Alexis Goodell, MD     11/24/2019  5:20 PM ELECTROENCEPHALOGRAM REPORT Patient: Vanessa Garza       Room #: [id number] EEG No. ID: [id number] Age: 45 y.o.        Sex: female Requesting Physician: Dhungel Report Date:  11/24/2019       Interpreting Physician: Alexis Goodell History: Vanessa Garza is an 44 y.o. female with new onset seizure Medications: Lopressor Conditions of Recording:  This is a 21 channel routine scalp EEG performed with bipolar and monopolar montages arranged in accordance to the international 10/20 system of electrode placement. One channel was dedicated to EKG recording. The patient is in the awake state. Description:  The background activity is slow and poorly organized.  It consists of low voltage activity in the delta-theta continuum.  This activity is diffusely distributed and continuous throughout the recording. No epileptiform activity is noted.  Hyperventilation was not performed.  Intermittent photic stimulation was performed but failed to illicit any change in the tracing. IMPRESSION: This is an abnormal EEG secondary to general background slowing.  This finding may be seen with a diffuse disturbance that is  etiologically nonspecific, but may include a metabolic encephalopathy or post-ictal state, among other possibilities.  No epileptiform activity is noted.  Alexis Goodell, MD Neurology [phone] 11/24/2019, 5:16 PM   CT Head Wo Contrast  Result Date: 11/24/2019 CLINICAL DATA:  Seizure, abnormal neurologic examination EXAM: CT HEAD WITHOUT CONTRAST TECHNIQUE: Contiguous axial images were obtained from the base of the skull through the vertex without intravenous contrast. COMPARISON:  None. FINDINGS: Brain: No acute infarct or hemorrhage. Lateral ventricles and midline structures are unremarkable. No acute extra-axial fluid collections. No mass effect. Vascular: No hyperdense vessel or unexpected calcification. Skull: Normal. Negative for fracture or focal lesion. Sinuses/Orbits: No acute finding. Other: None. IMPRESSION: 1. No acute intracranial process. Electronically Signed   By: Randa Ngo M.D.   On: 11/24/2019 16:04    ____________________________________________  PROCEDURES   Procedure(s) performed (including Critical Care):  Procedures  ____________________________________________  INITIAL IMPRESSION / MDM / [place] / ED COURSE  As part of my medical decision making, I reviewed the following data within the [place] notes reviewed and incorporated, Old chart reviewed, Notes from prior ED visits, and [place] Controlled Substance Database       *KAY BOECK was evaluated in Emergency Department on 11/24/2019 for the symptoms described in the history of present illness. She was evaluated in the context of the global COVID-19 pandemic, which necessitated consideration that the patient might be at risk for infection with the SARS-CoV-2 virus that causes COVID-19. Institutional protocols and algorithms that pertain to the evaluation of patients at risk for COVID-19 are in a state of rapid change based on information released by regulatory bodies  including the CDC and federal and state organizations. These policies and algorithms were followed during the patient's care in the ED.  Some ED evaluations and interventions may be delayed as a result of limited staffing during the pandemic.*     Medical Decision Making: 44 year old female here with new onset seizure-like activity.  No focal neurological deficits.  She remains slightly confused, likely due to postictal state.  Dr. Doy Mince of neurology consulted and EEG obtained, which shows no ongoing seizure activity.  Will admit for work-up.  ____________________________________________  FINAL CLINICAL IMPRESSION(S) / ED DIAGNOSES  Final diagnoses:  New onset seizure ([place])     MEDICATIONS GIVEN DURING THIS VISIT:  Medications  sodium chloride flush (NS) 0.9 % injection 3 mL (3 mLs Intravenous Not Given 11/24/19 1127)  enoxaparin (LOVENOX) injection 40 mg (has no administration in time range)  acetaminophen (TYLENOL) tablet 650 mg (has no administration in time range)    Or  acetaminophen (TYLENOL) suppository 650 mg (has no administration in time range)  LORazepam (ATIVAN) injection 1 mg (has no administration in time range)  metoprolol tartrate (LOPRESSOR) tablet 25 mg (has no administration in time range)  potassium chloride SA (KLOR-CON) CR tablet 20 mEq (has no administration in time range)  insulin aspart (novoLOG) injection 0-9 Units (0 Units Subcutaneous Not Given 11/24/19 1735)     ED Discharge Orders    None       Note:  This document was prepared using Dragon voice recognition software and may include unintentional dictation errors.   Duffy Bruce, MD 11/24/19 [facility]    Duffy Bruce, MD 11/24/19 [phone]

## 2019-11-24 NOTE — Procedures (Signed)
ELECTROENCEPHALOGRAM REPORT   Patient: Vanessa Garza       Room #: [id number] EEG No. ID: [id number] Age: 44 y.o.        Sex: female Requesting Physician: Dhungel Report Date:  11/24/2019        Interpreting Physician: Alexis Goodell   History: Vanessa Garza is an 45 y.o. female with new onset seizure  Medications:  Lopressor  Conditions of Recording:  This is a 21 channel routine scalp EEG performed with bipolar and monopolar montages arranged in accordance to the international 10/20 system of electrode placement. One channel was dedicated to EKG recording.  The patient is in the awake state.  Description:  The background activity is slow and poorly organized.  It consists of low voltage activity in the delta-theta continuum.  This activity is diffusely distributed and continuous throughout the recording.  No epileptiform activity is noted.   Hyperventilation was not performed.  Intermittent photic stimulation was performed but failed to illicit any change in the tracing.    IMPRESSION: This is an abnormal EEG secondary to general background slowing.  This finding may be seen with a diffuse disturbance that is etiologically nonspecific, but may include a metabolic encephalopathy or post-ictal state, among other possibilities.  No epileptiform activity is noted.     Alexis Goodell, MD Neurology [phone] 11/24/2019, 5:16 PM

## 2019-11-24 NOTE — Progress Notes (Signed)
eeg completed ° °

## 2019-11-24 NOTE — H&P (Signed)
History and Physical    Vanessa Garza [id number] DOB: 09-18-1975 DOA: 11/24/2019  PCP: Remi Haggard, FNP  Patient coming from: Group home    Chief Complaint: Brought in by EMS for seizures  HPI: Vanessa Garza is a 44 y.o. female with medical history significant of bipolar 1 disorder with paranoid symptoms, depression, diabetes mellitus on oral hypoglycemic, GERD, hypertension, hyperlipidemia and obesity who was brought into ED by EMS from group home with witnessed seizures. History provided by caregiver at bedside who informed that when she saw her this morning patient was not communicating much but did eat her breakfast.  She had checked her blood glucose before breakfast and was in 110s.  Patient then went to the bathroom and the caregiver heard from noise and when she went in noted patient was shaking with her eyes frequently blinking and noncommunicative.  This lasted for about 2 minutes when she called EMS and patient slowly started opening her eyes.  When EMS checked her CBG, it was 145.  Patient denies any headache, blurred vision, nausea, vomiting, chest pain, shortness of breath, abdominal pain, dysuria, diarrhea, no recent illness or sick contact.  No fevers, chills, body aches.  No bowel or bladder incontinence during episode.  No prior history of seizures.  Patient was diagnosed with diabetes and started on Metformin 8 months back and added [place] about 2 months back. No other new medications, to acute use, alcohol use or illicit drug use.  Patient has been fully vaccinated for her COVID-19 3 weeks back.  ED Course: In the ED vitals were stable except for elevated blood pressure of 140s/100.  Patient more awake and alert.  Blood work showed WBC of 10.7, potassium of 3.4, creatinine of 1.08 and blood glucose of 1 6.  Head CT negative for acute findings.   EKG showed normal sinus rhythm at 76 with poor R wave progression and prolonged QTC of 512. Labs ordered for UA, urine drug  screen, magnesium, venous blood gas, COVID-19 PCR.  Hospitalist consulted for observation.  Neurology consulted and patient underwent EEG.  Review of Systems: As per HPI otherwise all other systems reviewed and are negative.   Past Medical History:  Diagnosis Date  . Bipolar 1 disorder ([place])   . Depression   . Diabetes mellitus without complication ([place])   . GERD (gastroesophageal reflux disease)   . Hyperlipemia   . Hypertension     History reviewed. No pertinent surgical history.  Social History  reports that she has never smoked. She has never used smokeless tobacco. She reports previous alcohol use. She reports previous drug use.  No Known Allergies  Family History  Family history unknown: Yes   No family history of heart disease  Prior to Admission medications   Medication Sig Start Date End Date Taking? Authorizing Provider  citalopram (CELEXA) 20 MG tablet Take 20 mg by mouth daily. 10/25/19   [provider]  cloZAPine (CLOZARIL) 100 MG tablet Take 450 mg by mouth daily. 10/25/19   [provider]  FARXIGA 5 MG TABS tablet Take 5 mg by mouth daily. 10/25/19   [provider]  hydrochlorothiazide (HYDRODIURIL) 25 MG tablet Take 25 mg by mouth daily. 10/25/19   [provider]  metFORMIN (GLUCOPHAGE) 500 MG tablet Take 500 mg by mouth 2 (two) times daily. 10/25/19   [provider]  metoprolol tartrate (LOPRESSOR) 25 MG tablet Take 25 mg by mouth 2 (two) times daily. 10/25/19   [provider]  pantoprazole (PROTONIX) 40 MG tablet Take 40 mg by mouth daily. 10/25/19   [provider]  potassium chloride SA (KLOR-CON) 20 MEQ tablet Take 20 mEq by mouth 2 (two) times daily. 10/25/19   [provider]  pravastatin (PRAVACHOL) 40 MG tablet Take 40 mg by mouth daily. 10/28/19   [provider]    Physical Exam: Vitals:   11/24/19 1045 11/24/19 1048 11/24/19 1245 11/24/19 1702  BP:  106/73 123/85 (!)  142/100  Pulse:  92 94 90  Resp:  14 16 19   Temp:  98.5 F (36.9 C) 97.8 F (36.6 C) 98.7 F (37.1 C)  TempSrc:  Oral Oral Oral  SpO2:  99% 100% 99%  Weight: 108.9 kg     Height: 5\' 8"  (1.727 m)       Constitutional: NAD, calm, comfortable Vitals:   11/24/19 1045 11/24/19 1048 11/24/19 1245 11/24/19 1702  BP:  106/73 123/85 (!) 142/100  Pulse:  92 94 90  Resp:  14 16 19   Temp:  98.5 F (36.9 C) 97.8 F (36.6 C) 98.7 F (37.1 C)  TempSrc:  Oral Oral Oral  SpO2:  99% 100% 99%  Weight: 108.9 kg     Height: 5\' 8"  (1.727 m)      General: Middle-aged obese female lying in bed in no acute distress HEENT: Pupils reactive bilaterally, EOMI, no pallor, no icterus, moist mucosa, supple neck, no cervical lymphadenopathy, positive for right-sided tongue bite Chest: Clear to auscultation bilaterally, no added sound CVs: S1-S2, no murmur rub or gallop GI: Soft, nondistended, nontender, bowel sounds present Musculoskeletal: Warm, no edema CNs: Alert and awake, oriented x2 (has some confusion with date) ,moving all extremities, no focal deficit   Labs on Admission: I have personally reviewed following labs and imaging studies  CBC: Recent Labs  Lab 11/24/19 1103  WBC 10.7*  HGB 12.9  HCT 38.5  MCV 87.9  PLT 0000000    Basic Metabolic Panel: Recent Labs  Lab 11/24/19 1103  NA 140  K 3.4*  CL 104  CO2 26  GLUCOSE 116*  BUN 10  CREATININE 1.08*  CALCIUM 9.2    GFR: Estimated Creatinine Clearance: 85.9 mL/min (A) (by C-G formula based on SCr of 1.08 mg/dL (H)).  Liver Function Tests: Recent Labs  Lab 11/24/19 1103  AST 24  ALT 13  ALKPHOS 58  BILITOT 0.7  PROT 7.6  ALBUMIN 4.1    Urine analysis: No results found for: COLORURINE, APPEARANCEUR, LABSPEC, PHURINE, GLUCOSEU, HGBUR, BILIRUBINUR, KETONESUR, PROTEINUR, UROBILINOGEN, NITRITE, LEUKOCYTESUR  Radiological Exams on Admission: CT Head Wo Contrast  Result Date: 11/24/2019 CLINICAL DATA:  Seizure, abnormal  neurologic examination EXAM: CT HEAD WITHOUT CONTRAST TECHNIQUE: Contiguous axial images were obtained from the base of the skull through the vertex without intravenous contrast. COMPARISON:  None. FINDINGS: Brain: No acute infarct or hemorrhage. Lateral ventricles and midline structures are unremarkable. No acute extra-axial fluid collections. No mass effect. Vascular: No hyperdense vessel or unexpected calcification. Skull: Normal. Negative for fracture or focal lesion. Sinuses/Orbits: No acute finding. Other: None. IMPRESSION: 1. No acute intracranial process. Electronically Signed   By: Randa Ngo M.D.   On: 11/24/2019 16:04    EKG: Normal sinus rhythm at 76 with poor R wave progression, prolonged QTC of 500.  Assessment/Plan Principal Problem:   Seizures ([place]) New onset.  No clear trigger.  No signs or symptoms of hypoglycemia.  No clinical signs of infection.  Check UA, urine drug screen, CK,  magnesium and phosphorus.  Has been vaccinated for COVID-19. Ativan as needed for seizures.  Neurology consult appreciated.  EEG done shows slowing without epileptiform discharge.  Head CT without acute findings.  Recommends MRI brain with and without contrast.  Seizure precautions. Neurology recommends no need for anticoagulation therapy given for seizure and recommend loading with IV Depakote if she has recurrent seizures.  Active Problems:   Type 2 diabetes mellitus without complication (HCC) No hypoglycemic episode per caregiver.  Started [place] 2 months back.  Will hold both Farxiga and Metformin and monitor on sliding scale coverage.    Toxic metabolic encephalopathy Postictal state.  Slowly improving.  Monitor for now.    Essential hypertension Stable.  Continue beta-blocker.  Hold HCTZ.    Hypokalemia Chronic.  Resume home supplement.  Prolonged QTC Replace potassium.  Check magnesium level.  Hold Celexa and clozapine.    Bipolar disorder ([place]) Hold Celexa and clozapine due to  prolonged QTC.  Check EKG in a.m.   DVT prophylaxis: Subcu Lovenox Code Status:   Full code Family Communication:  Discussed with caregiver at bedside  Disposition Plan:   Patient is from:  Group home  Anticipated DC to:  Group home  Anticipated DC date:  5/27  Anticipated DC barriers: None   Consults called:  Neurology (Dr. Doy Mince) Admission status:  Observation (medical floor on telemetry)  Severity of Illness: The appropriate patient status for this patient is OBSERVATION. Observation status is judged to be reasonable and necessary in order to provide the required intensity of service to ensure the patient's safety. The patient's presenting symptoms, physical exam findings, and initial radiographic and laboratory data in the context of their medical condition is felt to place them at decreased risk for further clinical deterioration. Furthermore, it is anticipated that the patient will be medically stable for discharge from the hospital within 2 midnights of admission. The following factors support the patient status of observation.   " The patient's presenting symptoms include seizures with encephalopathy " The physical exam findings include acute toxic metabolic encephalopathy. " The initial radiographic and laboratory data are hypokalemia, leukocytosis      Tayli Buch MD [facility]  How to contact the [facility] Attending or Consulting provider [place] or covering provider during after hours [place], for this patient?   1. Check the care team in [facility] and look for a) attending/consulting TRH provider listed and b) the [facility] team listed 2. Log into [email or web address] and use [place]'s universal password to access. If you do not have the password, please contact the hospital operator. 3. Locate the [facility] provider you are looking for under [facility] and page to a number that you can be directly reached. 4. If you still have difficulty reaching the provider, please page  the [facility] (Director on Call) for the Hospitalists listed on amion for assistance.  11/24/2019, 5:04 PM

## 2019-11-24 NOTE — ED Notes (Signed)
Assisted pt to toilet. Pt was feeling lightlessness. Vs rechecked.

## 2019-11-24 NOTE — ED Triage Notes (Signed)
Patient states she has no idea why she is at the ED this morning.  Patient denies history of seizures.    AAOx3.  Skin warm and dry. NAD  Slow to answer questions.  Speech clear.

## 2019-11-24 NOTE — ED Notes (Signed)
Family allowed to stay with pt. Pt drowsy but still makes eye contact when called.

## 2019-11-24 NOTE — Consult Note (Signed)
Reason for Consult:seizure Requesting Physician: Ellender Hose  CC: Seizure  I have been asked by Dr. Ellender Hose to see this patient in consultation for new onset seizure.  HPI: Vanessa Garza is an 44 y.o. female with a history of HTN, HLD, DM and bipolar disease who presents with new onset seizure.  Per caregiver patient was not as usual this morning but did come to breakfast and eat.  Afterward went back to bed which is unusual.  Later went to th bathroom.  Noise was heard and when staff entered patient was extended and having clonic activity.  EMS was called and patient was brought in for evaluation.  Patient has had no further seizure activity since presentation but is slow to answer and has frequent eye blinking.  Consult called for further recommendations.  Patient with no history of seizures.    Past Medical History:  Diagnosis Date  . Bipolar 1 disorder ([place])   . Depression   . Diabetes mellitus without complication ([place])   . GERD (gastroesophageal reflux disease)   . Hyperlipemia   . Hypertension     History reviewed. No pertinent surgical history.  Family History  Family history unknown: Yes    Social History:  reports that she has never smoked. She has never used smokeless tobacco. She reports previous alcohol use. She reports previous drug use.  No Known Allergies  Medications: I have reviewed the patient's current medications. Prior to Admission medications   Medication Sig Start Date End Date Taking? Authorizing Provider  citalopram (CELEXA) 20 MG tablet Take 20 mg by mouth daily. 10/25/19   [provider]  cloZAPine (CLOZARIL) 100 MG tablet Take 450 mg by mouth daily. 10/25/19   [provider]  FARXIGA 5 MG TABS tablet Take 5 mg by mouth daily. 10/25/19   [provider]  hydrochlorothiazide (HYDRODIURIL) 25 MG tablet Take 25 mg by mouth daily. 10/25/19   [provider]  metFORMIN (GLUCOPHAGE) 500 MG tablet Take 500 mg by mouth 2 (two) times  daily. 10/25/19   [provider]  metoprolol tartrate (LOPRESSOR) 25 MG tablet Take 25 mg by mouth 2 (two) times daily. 10/25/19   [provider]  pantoprazole (PROTONIX) 40 MG tablet Take 40 mg by mouth daily. 10/25/19   [provider]  potassium chloride SA (KLOR-CON) 20 MEQ tablet Take 20 mEq by mouth 2 (two) times daily. 10/25/19   [provider]  pravastatin (PRAVACHOL) 40 MG tablet Take 40 mg by mouth daily. 10/28/19   [provider]    ROS: History obtained from the patient  General ROS: negative for - chills, fatigue, fever, night sweats, weight gain or weight loss Psychological ROS: negative for - behavioral disorder, hallucinations, memory difficulties, mood swings or suicidal ideation Ophthalmic ROS: negative for - blurry vision, double vision, eye pain or loss of vision ENT ROS: negative for - epistaxis, nasal discharge, oral lesions, sore throat, tinnitus or vertigo Allergy and Immunology ROS: negative for - hives or itchy/watery eyes Hematological and Lymphatic ROS: negative for - bleeding problems, bruising or swollen lymph nodes Endocrine ROS: negative for - galactorrhea, hair pattern changes, polydipsia/polyuria or temperature intolerance Respiratory ROS: negative for - cough, hemoptysis, shortness of breath or wheezing Cardiovascular ROS: negative for - chest pain, dyspnea on exertion, edema or irregular heartbeat Gastrointestinal ROS: negative for - abdominal pain, diarrhea, hematemesis, nausea/vomiting or stool incontinence Genito-Urinary ROS: negative for - dysuria, hematuria, incontinence or urinary frequency/urgency Musculoskeletal ROS: negative for - joint swelling  or muscular weakness Neurological ROS: as noted in HPI Dermatological ROS: negative for rash and skin lesion changes  Physical Examination: Blood pressure (!) 142/100, pulse 90, temperature 98.7 F (37.1 C), temperature source Oral, resp. rate 19, height 5\' 8"   (1.727 m), weight 108.9 kg, SpO2 99 %.  HEENT-  Normocephalic, no lesions, without obvious abnormality.  Normal external eye and conjunctiva.  Normal TM's bilaterally.  Normal auditory canals and external ears. Normal external nose, mucus membranes and septum.  Normal pharynx. Cardiovascular- S1, S2 normal, pulses palpable throughout   Lungs- chest clear, no wheezing, rales, normal symmetric air entry Abdomen- soft, non-tender; bowel sounds normal; no masses,  no organomegaly Extremities- BLE edema Lymph-no adenopathy palpable Musculoskeletal-no joint tenderness, deformity or swelling Skin-warm and dry, no hyperpigmentation, vitiligo, or suspicious lesions  Neurological Examination   Mental Status: Alert and alert.  Cooperative.  Speech fluent without evidence of aphasia.  Able to follow simple commands requires extensive reinforcement for 3 step commands. Cranial Nerves: II: Discs flat bilaterally; Visual fields grossly normal, pupils equal, round, reactive to light and accommodation III,IV, VI: ptosis not present, extra-ocular motions intact bilaterally V,VII: smile symmetric, facial light touch sensation normal bilaterally VIII: hearing normal bilaterally IX,X: gag reflex present XI: bilateral shoulder shrug XII: midline tongue extension Motor: Right : Upper extremity   5/5    Left:     Upper extremity   5/5  Lower extremity   5/5     Lower extremity   5/5 Tone and bulk:normal tone throughout; no atrophy noted Sensory: Pinprick and light touch intact throughout, bilaterally Deep Tendon Reflexes: Symmetric throughout Plantars: Right: mute   Left: mute Cerebellar: Normal finger-to-nose testing.  Unable to perform heel-to-shin testing due to ability to follow commands Gait: not tested due to safety concerns    Laboratory Studies:   Basic Metabolic Panel: Recent Labs  Lab 11/24/19 1103  NA 140  K 3.4*  CL 104  CO2 26  GLUCOSE 116*  BUN 10  CREATININE 1.08*  CALCIUM 9.2     Liver Function Tests: Recent Labs  Lab 11/24/19 1103  AST 24  ALT 13  ALKPHOS 58  BILITOT 0.7  PROT 7.6  ALBUMIN 4.1   No results for input(s): LIPASE, AMYLASE in the last 168 hours. No results for input(s): AMMONIA in the last 168 hours.  CBC: Recent Labs  Lab 11/24/19 1103  WBC 10.7*  HGB 12.9  HCT 38.5  MCV 87.9  PLT 179    Cardiac Enzymes: No results for input(s): CKTOTAL, CKMB, CKMBINDEX, TROPONINI in the last 168 hours.  BNP: Invalid input(s): POCBNP  CBG: Recent Labs  Lab 11/24/19 1103 11/24/19 1710  GLUCAP 105* 90    Microbiology: No results found for this or any previous visit.  Coagulation Studies: No results for input(s): LABPROT, INR in the last 72 hours.  Urinalysis: No results for input(s): COLORURINE, LABSPEC, PHURINE, GLUCOSEU, HGBUR, BILIRUBINUR, KETONESUR, PROTEINUR, UROBILINOGEN, NITRITE, LEUKOCYTESUR in the last 168 hours.  Invalid input(s): APPERANCEUR  Lipid Panel:  No results found for: CHOL, TRIG, HDL, CHOLHDL, VLDL, LDLCALC  HgbA1C: No results found for: HGBA1C  Urine Drug Screen:  No results found for: LABOPIA, COCAINSCRNUR, LABBENZ, AMPHETMU, THCU, LABBARB  Alcohol Level: No results for input(s): ETH in the last 168 hours.  Other results: EKG: sinus rhythm at 90 bpm.  Imaging: EEG  Result Date: 11/24/2019 Alexis Goodell, MD     11/24/2019  5:20 PM ELECTROENCEPHALOGRAM REPORT Patient: NASREEN GARRAND  Room #: [id number] EEG No. ID: [id number] Age: 44 y.o.        Sex: female Requesting Physician: Dhungel Report Date:  11/24/2019       Interpreting Physician: Alexis Goodell History: RHEANA KLEISER is an 44 y.o. female with new onset seizure Medications: Lopressor Conditions of Recording:  This is a 21 channel routine scalp EEG performed with bipolar and monopolar montages arranged in accordance to the international 10/20 system of electrode placement. One channel was dedicated to EKG recording. The patient is in the awake state.  Description:  The background activity is slow and poorly organized.  It consists of low voltage activity in the delta-theta continuum.  This activity is diffusely distributed and continuous throughout the recording. No epileptiform activity is noted.  Hyperventilation was not performed.  Intermittent photic stimulation was performed but failed to illicit any change in the tracing. IMPRESSION: This is an abnormal EEG secondary to general background slowing.  This finding may be seen with a diffuse disturbance that is etiologically nonspecific, but may include a metabolic encephalopathy or post-ictal state, among other possibilities.  No epileptiform activity is noted.  Alexis Goodell, MD Neurology [phone] 11/24/2019, 5:16 PM   CT Head Wo Contrast  Result Date: 11/24/2019 CLINICAL DATA:  Seizure, abnormal neurologic examination EXAM: CT HEAD WITHOUT CONTRAST TECHNIQUE: Contiguous axial images were obtained from the base of the skull through the vertex without intravenous contrast. COMPARISON:  None. FINDINGS: Brain: No acute infarct or hemorrhage. Lateral ventricles and midline structures are unremarkable. No acute extra-axial fluid collections. No mass effect. Vascular: No hyperdense vessel or unexpected calcification. Skull: Normal. Negative for fracture or focal lesion. Sinuses/Orbits: No acute finding. Other: None. IMPRESSION: 1. No acute intracranial process. Electronically Signed   By: Randa Ngo M.D.   On: 11/24/2019 16:04     Assessment/Plan:  44 y.o. female with a history of HTN, HLD, DM and bipolar disease who presents with new onset seizure.  Blood sugar normal when checked at the facility.  Patient has not returned to baseline but suspect this is secondary to being post-ictal.  EEG only significant for slowing.  Head CT reviewed and shows no acute changes.  Neurological examination is nonfocal.  Further work up recommended.    Recommendations: 1. MRI of the brain with and without  contrast 2. Seizure precautions 3. UDS, serum magnesium and phosphorus.   4, Would not initiate anticonvulsant therapy at this time but can initiate if seizure are recurrent.  Would recommend loading with Depakote at that time since this may help with her psychiatric issues as well.    Alexis Goodell, MD Neurology [phone] 11/24/2019, 5:20 PM

## 2019-11-25 DIAGNOSIS — R569 Unspecified convulsions: Secondary | ICD-10-CM | POA: Diagnosis not present

## 2019-11-25 DIAGNOSIS — E876 Hypokalemia: Secondary | ICD-10-CM | POA: Diagnosis not present

## 2019-11-25 DIAGNOSIS — I10 Essential (primary) hypertension: Secondary | ICD-10-CM | POA: Diagnosis not present

## 2019-11-25 LAB — GLUCOSE, CAPILLARY
Glucose-Capillary: 112 mg/dL — ABNORMAL HIGH (ref 70–99)
Glucose-Capillary: 125 mg/dL — ABNORMAL HIGH (ref 70–99)
Glucose-Capillary: 78 mg/dL (ref 70–99)
Glucose-Capillary: 93 mg/dL (ref 70–99)
Glucose-Capillary: 93 mg/dL (ref 70–99)

## 2019-11-25 LAB — CBC
HCT: 40.8 % (ref 36.0–46.0)
Hemoglobin: 13.5 g/dL (ref 12.0–15.0)
MCH: 29.2 pg (ref 26.0–34.0)
MCHC: 33.1 g/dL (ref 30.0–36.0)
MCV: 88.3 fL (ref 80.0–100.0)
Platelets: 190 10*3/uL (ref 150–400)
RBC: 4.62 MIL/uL (ref 3.87–5.11)
RDW: 13.2 % (ref 11.5–15.5)
WBC: 10 10*3/uL (ref 4.0–10.5)
nRBC: 0 % (ref 0.0–0.2)

## 2019-11-25 LAB — BLOOD GAS, VENOUS
Acid-Base Excess: 4.9 mmol/L — ABNORMAL HIGH (ref 0.0–2.0)
Bicarbonate: 31.4 mmol/L — ABNORMAL HIGH (ref 20.0–28.0)
O2 Saturation: 30.7 %
Patient temperature: 37
pCO2, Ven: 53 mmHg (ref 44.0–60.0)
pH, Ven: 7.38 (ref 7.250–7.430)

## 2019-11-25 LAB — BASIC METABOLIC PANEL
Anion gap: 11 (ref 5–15)
BUN: 10 mg/dL (ref 6–20)
CO2: 26 mmol/L (ref 22–32)
Calcium: 9.6 mg/dL (ref 8.9–10.3)
Chloride: 104 mmol/L (ref 98–111)
Creatinine, Ser: 1.19 mg/dL — ABNORMAL HIGH (ref 0.44–1.00)
GFR calc Af Amer: >60 mL/min (ref >60)
GFR calc non Af Amer: 55 mL/min — ABNORMAL LOW (ref >60)
Glucose, Bld: 125 mg/dL — ABNORMAL HIGH (ref 70–99)
Potassium: 3.5 mmol/L (ref 3.5–5.1)
Sodium: 141 mmol/L (ref 135–145)

## 2019-11-25 NOTE — Discharge Instructions (Signed)
Seizure, Adult °A seizure is a sudden burst of abnormal electrical activity in the brain. Seizures usually last from 30 seconds to 2 minutes. They can cause many different symptoms. °Usually, seizures are not harmful unless they last a long time. °What are the causes? °Common causes of this condition include: °· Fever or infection. °· Conditions that affect the brain, such as: °? A brain abnormality that you were born with. °? A brain or head injury. °? Bleeding in the brain. °? A tumor. °? Stroke. °? Brain disorders such as autism or cerebral palsy. °· Low blood sugar. °· Conditions that are passed from parent to child (are inherited). °· Problems with substances, such as: °? Having a reaction to a drug or a medicine. °? Suddenly stopping the use of a substance (withdrawal). °In some cases, the cause may not be known. A person who has repeated seizures over time without a clear cause has a condition called epilepsy. °What increases the risk? °You are more likely to get this condition if you have: °· A family history of epilepsy. °· Had a seizure in the past. °· A brain disorder. °· A history of head injury, lack of oxygen at birth, or strokes. °What are the signs or symptoms? °There are many types of seizures. The symptoms vary depending on the type of seizure you have. Examples of symptoms during a seizure include: °· Shaking (convulsions). °· Stiffness in the body. °· Passing out (losing consciousness). °· Head nodding. °· Staring. °· Not responding to sound or touch. °· Loss of bladder control and bowel control. °Some people have symptoms right before and right after a seizure happens. °Symptoms before a seizure may include: °· Fear. °· Worry (anxiety). °· Feeling like you may vomit (nauseous). °· Feeling like the room is spinning (vertigo). °· Feeling like you saw or heard something before (déjà vu). °· Odd tastes or smells. °· Changes in how you see. You may see flashing lights or spots. °Symptoms after a  seizure happens can include: °· Confusion. °· Sleepiness. °· Headache. °· Weakness on one side of the body. °How is this treated? °Most seizures will stop on their own in under 5 minutes. In these cases, no treatment is needed. Seizures that last longer than 5 minutes will usually need treatment. Treatment can include: °· Medicines given through an IV tube. °· Avoiding things that are known to cause your seizures. These can include medicines that you take for another condition. °· Medicines to treat epilepsy. °· Surgery to stop the seizures. This may be needed if medicines do not help. °Follow these instructions at home: °Medicines °· Take over-the-counter and prescription medicines only as told by your doctor. °· Do not eat or drink anything that may keep your medicine from working, such as alcohol. °Activity °· Do not do any activities that would be dangerous if you had another seizure, like driving or swimming. Wait until your doctor says it is safe for you to do them. °· If you live in the [place] ask your local DMV (department of motor vehicles) when you can drive. °· Get plenty of rest. °Teaching others °Teach friends and family what to do when you have a seizure. They should: °· Lay you on the ground. °· Protect your head and body. °· Loosen any tight clothing around your neck. °· Turn you on your side. °· Not hold you down. °· Not put anything into your mouth. °· Know whether or not you need emergency care. °· Stay   with you until you are better. ° °General instructions °· Contact your doctor each time you have a seizure. °· Avoid anything that gives you seizures. °· Keep a seizure diary. Write down: °? What you think caused each seizure. °? What you remember about each seizure. °· Keep all follow-up visits as told by your doctor. This is important. °Contact a doctor if: °· You have another seizure. °· You have seizures more often. °· There is any change in what happens during your seizures. °· You keep having  seizures with treatment. °· You have symptoms of being sick or having an infection. °Get help right away if: °· You have a seizure that: °? Lasts longer than 5 minutes. °? Is different than seizures you had before. °? Makes it harder to breathe. °? Happens after you hurt your head. °· You have any of these symptoms after a seizure: °? Not being able to speak. °? Not being able to use a part of your body. °? Confusion. °? A bad headache. °· You have two or more seizures in a row. °· You do not wake up right after a seizure. °· You get hurt during a seizure. °These symptoms may be an emergency. Do not wait to see if the symptoms will go away. Get medical help right away. Call your local emergency services (911 in the [place] Do not drive yourself to the hospital. °Summary °· Seizures usually last from 30 seconds to 2 minutes. Usually, they are not harmful unless they last a long time. °· Do not eat or drink anything that may keep your medicine from working, such as alcohol. °· Teach friends and family what to do when you have a seizure. °· Contact your doctor each time you have a seizure. °This information is not intended to replace advice given to you by your health care provider. Make sure you discuss any questions you have with your health care provider. °Document Revised: 09/04/2018 Document Reviewed: 09/04/2018 °[employer] Patient Education © 2020 [employer] ° °

## 2019-11-25 NOTE — Discharge Summary (Signed)
Physician Discharge Summary  KAMALI CARMOUCHE [id number] DOB: 11-19-1975 DOA: 11/24/2019  PCP: Remi Haggard, FNP  Admit date: 11/24/2019 Discharge date: 11/25/2019  Admitted From: Group home Disposition: Return to group home  Recommendations for Outpatient Follow-up:  Follow-up with PCP in 1 week.  Recheck EKG to monitor her QTC and adjust/discontinue medications (Celexa and clozapine)  Home Health: None Equipment/Devices: None Discharge Condition: Fair  CODE STATUS: Full code Diet recommendation: 2 g sodium/ Carb Modified   Discharge Diagnoses:  Principal Problem:   Seizures ([place])  Active Problems:   Type 2 diabetes mellitus without complication (HCC)   Toxic metabolic encephalopathy   Essential hypertension   Hypokalemia   Bipolar disorder (HCC) Prolonged QT interval  Brief narrative/HPI 44 y.o. female with medical history significant of bipolar 1 disorder with paranoid symptoms, depression, diabetes mellitus on oral hypoglycemic, GERD, hypertension, hyperlipidemia and obesity who was brought into ED by EMS from group home with witnessed seizures. History provided by caregiver at bedside who informed that when she saw her this morning patient was not communicating much but did eat her breakfast.  She had checked her blood glucose before breakfast and was in 110s.  Patient then went to the bathroom and the caregiver heard from noise and when she went in noted patient was shaking with her eyes frequently blinking and noncommunicative.  This lasted for about 2 minutes when she called EMS and patient slowly started opening her eyes.  When EMS checked her CBG, it was 145.    No new medications, recent illness, headaches, dizziness, nausea, vomiting, dysuria, fevers, chills or sick contact.  ED Course: In the ED vitals were stable except for elevated blood pressure of 140s/100.  Patient more awake and alert.  Blood work showed WBC of 10.7, potassium of 3.4, creatinine of 1.08 and  blood glucose of 1 6.  Head CT negative for acute findings.   EKG showed normal sinus rhythm at 76 with poor R wave progression and prolonged QTC of 512. Labs ordered for UA, urine drug screen, magnesium, venous blood gas.  Hospitalist consulted for observation.  Hospital course  Principal Problem:   Seizures ([place]) New onset.  No clear trigger.  No signs or symptoms of hypoglycemia.  No clinical signs of infection.    UA, Ativan as needed for seizures.  Neurology consult appreciated.  EEG done shows slowing without epileptiform discharge.  Head CT without acute findings.    MRI brain with and without contrast done and was normal.  Neurology recommends no need for anticoagulation therapy given for seizure given single episode and no further symptoms.  Recommend she may benefit from Depakote if she has recurrent seizures.  Patient clinically stable with no further symptoms and mentation improved.  Stable to be discharged back to group home today.   Active Problems:   Type 2 diabetes mellitus without complication (HCC) No hypoglycemic episode per caregiver.  Started [place] 2 months back.    CBG stable.  Resume home meds.    Toxic metabolic encephalopathy Postictal state.    Now improved.    Essential hypertension Stable.    Continue home blood pressure meds.    Hypokalemia Chronic.  Resume home supplement.  Prolonged QTC Potassium replaced.  Magnesium normal.  Celexa and clozapine were held.  Her QTC is still 560 this morning.  I have resumed her Celexa and clozapine but her QTC needs to be monitored closely and medications adjusted/discontinued if needed.    Bipolar disorder ([place]) Resumed  Celexa and clozapine but need to check EKG in the next 1-2 days and monitor QTC and addressed dose/medication as appropriate.  Procedure: EEG/head CT, MRI brain Consults: Neurology Family communication: Discussed with caregiver at bedside on admission.  Disposition: Stable for return to  group home.  Discharge Instructions   Allergies as of 11/25/2019   No Known Allergies     Medication List    TAKE these medications   citalopram 20 MG tablet Commonly known as: CELEXA Take 20 mg by mouth daily.   cloZAPine 100 MG tablet Commonly known as: CLOZARIL Take 450 mg by mouth daily.   Farxiga 5 MG Tabs tablet Generic drug: dapagliflozin propanediol Take 5 mg by mouth daily.   hydrochlorothiazide 25 MG tablet Commonly known as: HYDRODIURIL Take 25 mg by mouth daily.   metFORMIN 500 MG tablet Commonly known as: GLUCOPHAGE Take 500 mg by mouth 2 (two) times daily with a meal.   metoprolol tartrate 25 MG tablet Commonly known as: LOPRESSOR Take 25 mg by mouth 2 (two) times daily.   multivitamin with minerals tablet Take 1 tablet by mouth daily.   pantoprazole 40 MG tablet Commonly known as: PROTONIX Take 40 mg by mouth daily.   potassium chloride SA 20 MEQ tablet Commonly known as: KLOR-CON Take 20 mEq by mouth 2 (two) times daily with a meal.   pravastatin 40 MG tablet Commonly known as: PRAVACHOL Take 40 mg by mouth daily.   Vitamin D-3 125 MCG (5000 UT) Tabs Take 5,000 Units by mouth daily.      Follow-up Information    Remi Haggard, FNP. Schedule an appointment as soon as possible for a visit in 1 week(s).   Specialty: Family Medicine Contact information: [address] [address] [phone]          No Known Allergies      Procedures/Studies: EEG  Result Date: 11/24/2019 Alexis Goodell, MD     11/24/2019  5:20 PM ELECTROENCEPHALOGRAM REPORT Patient: ODDIE HWANG       Room #: [id number] EEG No. ID: [id number] Age: 44 y.o.        Sex: female Requesting Physician: Azusena Erlandson Report Date:  11/24/2019       Interpreting Physician: Alexis Goodell History: MEKHIA CASAL is an 44 y.o. female with new onset seizure Medications: Lopressor Conditions of Recording:  This is a 21 channel routine scalp EEG performed with bipolar  and monopolar montages arranged in accordance to the international 10/20 system of electrode placement. One channel was dedicated to EKG recording. The patient is in the awake state. Description:  The background activity is slow and poorly organized.  It consists of low voltage activity in the delta-theta continuum.  This activity is diffusely distributed and continuous throughout the recording. No epileptiform activity is noted.  Hyperventilation was not performed.  Intermittent photic stimulation was performed but failed to illicit any change in the tracing. IMPRESSION: This is an abnormal EEG secondary to general background slowing.  This finding may be seen with a diffuse disturbance that is etiologically nonspecific, but may include a metabolic encephalopathy or post-ictal state, among other possibilities.  No epileptiform activity is noted.  Alexis Goodell, MD Neurology [phone] 11/24/2019, 5:16 PM   CT Head Wo Contrast  Result Date: 11/24/2019 CLINICAL DATA:  Seizure, abnormal neurologic examination EXAM: CT HEAD WITHOUT CONTRAST TECHNIQUE: Contiguous axial images were obtained from the base of the skull through the vertex without intravenous contrast. COMPARISON:  None. FINDINGS: Brain: No  acute infarct or hemorrhage. Lateral ventricles and midline structures are unremarkable. No acute extra-axial fluid collections. No mass effect. Vascular: No hyperdense vessel or unexpected calcification. Skull: Normal. Negative for fracture or focal lesion. Sinuses/Orbits: No acute finding. Other: None. IMPRESSION: 1. No acute intracranial process. Electronically Signed   By: Randa Ngo M.D.   On: 11/24/2019 16:04   MR BRAIN W WO CONTRAST  Result Date: 11/24/2019 CLINICAL DATA:  Encephalopathy. Seizure-like activity. EXAM: MRI HEAD WITHOUT AND WITH CONTRAST TECHNIQUE: Multiplanar, multiecho pulse sequences of the brain and surrounding structures were obtained without and with intravenous contrast.  CONTRAST:  73mL GADAVIST GADOBUTROL 1 MMOL/ML IV SOLN COMPARISON:  None. FINDINGS: BRAIN: No acute infarct, acute hemorrhage or extra-axial collection. Normal white matter signal. Normal volume of CSF spaces. No chronic microhemorrhage. Normal midline structures. VASCULAR: Major flow voids are preserved. SKULL AND UPPER CERVICAL SPINE: Normal calvarium and skull base. Visualized upper cervical spine and soft tissues are normal. SINUSES/ORBITS: No paranasal sinus fluid levels or advanced mucosal thickening. No mastoid or middle ear effusion. Normal orbits. IMPRESSION: Normal brain MRI. Electronically Signed   By: Ulyses Jarred M.D.   On: 11/24/2019 20:39    Subjective: Seen and examined.  Sleepy but arousable easily, denies any symptoms.  Discharge Exam: Vitals:   11/25/19 0630 11/25/19 0930  BP: 109/85 111/78  Pulse: 98 87  Resp: 16 16  Temp:    SpO2: 94% 100%   Vitals:   11/25/19 0545 11/25/19 0600 11/25/19 0630 11/25/19 0930  BP:  103/75 109/85 111/78  Pulse: 99  98 87  Resp:   16 16  Temp:      TempSrc:      SpO2: 95%  94% 100%  Weight:      Height:        General: Middle-aged obese female not in distress HEENT: Moist mucosa, supple neck Chest: Clear bilaterally CVs: Normal S1-S2, no murmurs GI: Soft, nondistended nontender Musculoskeletal: Sleepy arousable and oriented, nonfocal    The results of significant diagnostics from this hospitalization (including imaging, microbiology, ancillary and laboratory) are listed below for reference.     Microbiology: Recent Results (from the past 240 hour(s))  SARS Coronavirus 2 by RT PCR (hospital order, performed in [facility] hospital lab) Nasopharyngeal Nasopharyngeal Swab     Status: None   Collection Time: 11/24/19  5:03 PM   Specimen: Nasopharyngeal Swab  Result Value Ref Range Status   SARS Coronavirus 2 NEGATIVE NEGATIVE Final    Comment: (NOTE) SARS-CoV-2 target nucleic acids are NOT DETECTED. The SARS-CoV-2 RNA is  generally detectable in upper and lower respiratory specimens during the acute phase of infection. The lowest concentration of SARS-CoV-2 viral copies this assay can detect is 250 copies / mL. A negative result does not preclude SARS-CoV-2 infection and should not be used as the sole basis for treatment or other patient management decisions.  A negative result may occur with improper specimen collection / handling, submission of specimen other than nasopharyngeal swab, presence of viral mutation(s) within the areas targeted by this assay, and inadequate number of viral copies (<250 copies / mL). A negative result must be combined with clinical observations, patient history, and epidemiological information. Fact Sheet for Patients:   StrictlyIdeas.no Fact Sheet for Healthcare Providers: BankingDealers.co.za This test is not yet approved or cleared  by the [place] FDA and has been authorized for detection and/or diagnosis of SARS-CoV-2 by FDA under an Emergency Use Authorization (EUA).  This EUA will remain  in effect (meaning this test can be used) for the duration of the COVID-19 declaration under Section 564(b)(1) of the Act, 21 U.S.C. section 360bbb-3(b)(1), unless the authorization is terminated or revoked sooner. Performed at [facility], [address] [place], [place] [address]      Labs: BNP (last 3 results) No results for input(s): BNP in the last 8760 hours. Basic Metabolic Panel: Recent Labs  Lab 11/24/19 1103 11/24/19 1703 11/25/19 0438  NA 140  --  141  K 3.4*  --  3.5  CL 104  --  104  CO2 26  --  26  GLUCOSE 116*  --  125*  BUN 10  --  10  CREATININE 1.08*  --  1.19*  CALCIUM 9.2  --  9.6  MG  --  2.1  --   PHOS  --  3.3  --    Liver Function Tests: Recent Labs  Lab 11/24/19 1103  AST 24  ALT 13  ALKPHOS 58  BILITOT 0.7  PROT 7.6  ALBUMIN 4.1   No results for input(s): LIPASE,  AMYLASE in the last 168 hours. No results for input(s): AMMONIA in the last 168 hours. CBC: Recent Labs  Lab 11/24/19 1103 11/25/19 0438  WBC 10.7* 10.0  HGB 12.9 13.5  HCT 38.5 40.8  MCV 87.9 88.3  PLT 179 190   Cardiac Enzymes: Recent Labs  Lab 11/24/19 1703  CKTOTAL 135   BNP: Invalid input(s): POCBNP CBG: Recent Labs  Lab 11/24/19 2036 11/25/19 0029 11/25/19 0444 11/25/19 0641 11/25/19 0927  GLUCAP 74 93 125* 112* 78   D-Dimer No results for input(s): DDIMER in the last 72 hours. Hgb A1c No results for input(s): HGBA1C in the last 72 hours. Lipid Profile No results for input(s): CHOL, HDL, LDLCALC, TRIG, CHOLHDL, LDLDIRECT in the last 72 hours. Thyroid function studies No results for input(s): TSH, T4TOTAL, T3FREE, THYROIDAB in the last 72 hours.  Invalid input(s): FREET3 Anemia work up No results for input(s): VITAMINB12, FOLATE, FERRITIN, TIBC, IRON, RETICCTPCT in the last 72 hours. Urinalysis No results found for: COLORURINE, APPEARANCEUR, [place], [place], [place], [place], [place], [place], PROTEINUR, UROBILINOGEN, NITRITE, LEUKOCYTESUR Sepsis Labs Invalid input(s): PROCALCITONIN,  WBC,  LACTICIDVEN Microbiology Recent Results (from the past 240 hour(s))  SARS Coronavirus 2 by RT PCR (hospital order, performed in [facility] hospital lab) Nasopharyngeal Nasopharyngeal Swab     Status: None   Collection Time: 11/24/19  5:03 PM   Specimen: Nasopharyngeal Swab  Result Value Ref Range Status   SARS Coronavirus 2 NEGATIVE NEGATIVE Final    Comment: (NOTE) SARS-CoV-2 target nucleic acids are NOT DETECTED. The SARS-CoV-2 RNA is generally detectable in upper and lower respiratory specimens during the acute phase of infection. The lowest concentration of SARS-CoV-2 viral copies this assay can detect is 250 copies / mL. A negative result does not preclude SARS-CoV-2 infection and should not be used as the sole basis for treatment or other patient  management decisions.  A negative result may occur with improper specimen collection / handling, submission of specimen other than nasopharyngeal swab, presence of viral mutation(s) within the areas targeted by this assay, and inadequate number of viral copies (<250 copies / mL). A negative result must be combined with clinical observations, patient history, and epidemiological information. Fact Sheet for Patients:   StrictlyIdeas.no Fact Sheet for Healthcare Providers: BankingDealers.co.za This test is not yet approved or cleared  by the [place] FDA and has been authorized for detection and/or diagnosis  of SARS-CoV-2 by FDA under an Emergency Use Authorization (EUA).  This EUA will remain in effect (meaning this test can be used) for the duration of the COVID-19 declaration under Section 564(b)(1) of the Act, 21 U.S.C. section 360bbb-3(b)(1), unless the authorization is terminated or revoked sooner. Performed at [facility], [address], [place],  [address]      Time coordinating discharge: 25 minutes  SIGNED:   Louellen Molder, MD  [facility] 11/25/2019, 11:00 AM Pager   If 7PM-7AM, please contact night-coverage [email or web address] Password [email or web address]

## 2019-11-25 NOTE — ED Notes (Signed)
CBG 93 on recheck. Insulin not needed. Pt d/c paperwork given to group home owner. Unable to sign due to being in downtime. Verbalized understanding of following up with neurology.

## 2019-11-25 NOTE — ED Notes (Signed)
Attempted to call group home to d/c pt. No answer. Voicemail left.

## 2019-11-30 DIAGNOSIS — E119 Type 2 diabetes mellitus without complications: Secondary | ICD-10-CM | POA: Diagnosis not present

## 2019-11-30 DIAGNOSIS — R569 Unspecified convulsions: Secondary | ICD-10-CM | POA: Diagnosis not present

## 2019-12-08 DIAGNOSIS — E119 Type 2 diabetes mellitus without complications: Secondary | ICD-10-CM | POA: Diagnosis not present

## 2019-12-28 DIAGNOSIS — R569 Unspecified convulsions: Secondary | ICD-10-CM | POA: Diagnosis not present

## 2019-12-31 DIAGNOSIS — E119 Type 2 diabetes mellitus without complications: Secondary | ICD-10-CM | POA: Diagnosis not present

## 2019-12-31 DIAGNOSIS — M67471 Ganglion, right ankle and foot: Secondary | ICD-10-CM | POA: Diagnosis not present

## 2019-12-31 DIAGNOSIS — M79675 Pain in left toe(s): Secondary | ICD-10-CM | POA: Diagnosis not present

## 2019-12-31 DIAGNOSIS — B351 Tinea unguium: Secondary | ICD-10-CM | POA: Diagnosis not present

## 2019-12-31 DIAGNOSIS — M79674 Pain in right toe(s): Secondary | ICD-10-CM | POA: Diagnosis not present

## 2020-01-04 DIAGNOSIS — R569 Unspecified convulsions: Secondary | ICD-10-CM | POA: Diagnosis not present

## 2020-01-11 DIAGNOSIS — R5383 Other fatigue: Secondary | ICD-10-CM | POA: Diagnosis not present

## 2020-01-11 DIAGNOSIS — E559 Vitamin D deficiency, unspecified: Secondary | ICD-10-CM | POA: Diagnosis not present

## 2020-01-11 DIAGNOSIS — I10 Essential (primary) hypertension: Secondary | ICD-10-CM | POA: Diagnosis not present

## 2020-01-11 DIAGNOSIS — K219 Gastro-esophageal reflux disease without esophagitis: Secondary | ICD-10-CM | POA: Diagnosis not present

## 2020-01-11 DIAGNOSIS — I2510 Atherosclerotic heart disease of native coronary artery without angina pectoris: Secondary | ICD-10-CM | POA: Diagnosis not present

## 2020-01-11 DIAGNOSIS — E118 Type 2 diabetes mellitus with unspecified complications: Secondary | ICD-10-CM | POA: Diagnosis not present

## 2020-01-11 DIAGNOSIS — E7800 Pure hypercholesterolemia, unspecified: Secondary | ICD-10-CM | POA: Diagnosis not present

## 2020-01-11 DIAGNOSIS — E119 Type 2 diabetes mellitus without complications: Secondary | ICD-10-CM | POA: Diagnosis not present

## 2020-01-11 DIAGNOSIS — I361 Nonrheumatic tricuspid (valve) insufficiency: Secondary | ICD-10-CM | POA: Diagnosis not present

## 2020-01-19 DIAGNOSIS — R569 Unspecified convulsions: Secondary | ICD-10-CM | POA: Diagnosis not present

## 2020-02-14 DIAGNOSIS — E041 Nontoxic single thyroid nodule: Secondary | ICD-10-CM | POA: Diagnosis not present

## 2020-02-14 DIAGNOSIS — E119 Type 2 diabetes mellitus without complications: Secondary | ICD-10-CM | POA: Diagnosis not present

## 2020-02-14 DIAGNOSIS — Z9049 Acquired absence of other specified parts of digestive tract: Secondary | ICD-10-CM | POA: Diagnosis not present

## 2020-02-14 DIAGNOSIS — R109 Unspecified abdominal pain: Secondary | ICD-10-CM | POA: Diagnosis not present

## 2020-04-19 DIAGNOSIS — E118 Type 2 diabetes mellitus with unspecified complications: Secondary | ICD-10-CM | POA: Diagnosis not present

## 2020-04-19 DIAGNOSIS — K219 Gastro-esophageal reflux disease without esophagitis: Secondary | ICD-10-CM | POA: Diagnosis not present

## 2020-04-19 DIAGNOSIS — Z0000 Encounter for general adult medical examination without abnormal findings: Secondary | ICD-10-CM | POA: Diagnosis not present

## 2020-04-19 DIAGNOSIS — Z23 Encounter for immunization: Secondary | ICD-10-CM | POA: Diagnosis not present

## 2020-04-19 DIAGNOSIS — E119 Type 2 diabetes mellitus without complications: Secondary | ICD-10-CM | POA: Diagnosis not present

## 2020-04-19 DIAGNOSIS — I2510 Atherosclerotic heart disease of native coronary artery without angina pectoris: Secondary | ICD-10-CM | POA: Diagnosis not present

## 2020-04-19 DIAGNOSIS — E559 Vitamin D deficiency, unspecified: Secondary | ICD-10-CM | POA: Diagnosis not present

## 2020-04-19 DIAGNOSIS — I10 Essential (primary) hypertension: Secondary | ICD-10-CM | POA: Diagnosis not present

## 2020-04-19 DIAGNOSIS — E7800 Pure hypercholesterolemia, unspecified: Secondary | ICD-10-CM | POA: Diagnosis not present

## 2020-04-19 DIAGNOSIS — Z79899 Other long term (current) drug therapy: Secondary | ICD-10-CM | POA: Diagnosis not present

## 2020-04-19 DIAGNOSIS — Z1389 Encounter for screening for other disorder: Secondary | ICD-10-CM | POA: Diagnosis not present

## 2020-04-19 DIAGNOSIS — R5383 Other fatigue: Secondary | ICD-10-CM | POA: Diagnosis not present

## 2020-07-25 DIAGNOSIS — Z13220 Encounter for screening for lipoid disorders: Secondary | ICD-10-CM | POA: Diagnosis not present

## 2020-07-25 DIAGNOSIS — I10 Essential (primary) hypertension: Secondary | ICD-10-CM | POA: Diagnosis not present

## 2020-07-25 DIAGNOSIS — E119 Type 2 diabetes mellitus without complications: Secondary | ICD-10-CM | POA: Diagnosis not present

## 2020-07-25 DIAGNOSIS — D519 Vitamin B12 deficiency anemia, unspecified: Secondary | ICD-10-CM | POA: Diagnosis not present

## 2020-07-25 DIAGNOSIS — E039 Hypothyroidism, unspecified: Secondary | ICD-10-CM | POA: Diagnosis not present

## 2020-07-25 DIAGNOSIS — E1142 Type 2 diabetes mellitus with diabetic polyneuropathy: Secondary | ICD-10-CM | POA: Diagnosis not present

## 2020-07-25 DIAGNOSIS — R000 Tachycardia, unspecified: Secondary | ICD-10-CM | POA: Diagnosis not present

## 2020-07-25 DIAGNOSIS — E1159 Type 2 diabetes mellitus with other circulatory complications: Secondary | ICD-10-CM | POA: Diagnosis not present

## 2020-07-25 DIAGNOSIS — E559 Vitamin D deficiency, unspecified: Secondary | ICD-10-CM | POA: Diagnosis not present

## 2020-07-25 DIAGNOSIS — K219 Gastro-esophageal reflux disease without esophagitis: Secondary | ICD-10-CM | POA: Diagnosis not present

## 2020-07-25 DIAGNOSIS — I361 Nonrheumatic tricuspid (valve) insufficiency: Secondary | ICD-10-CM | POA: Diagnosis not present

## 2020-07-25 DIAGNOSIS — G603 Idiopathic progressive neuropathy: Secondary | ICD-10-CM | POA: Diagnosis not present

## 2020-07-25 DIAGNOSIS — E7800 Pure hypercholesterolemia, unspecified: Secondary | ICD-10-CM | POA: Diagnosis not present

## 2020-07-25 DIAGNOSIS — Z79899 Other long term (current) drug therapy: Secondary | ICD-10-CM | POA: Diagnosis not present

## 2020-07-25 DIAGNOSIS — I739 Peripheral vascular disease, unspecified: Secondary | ICD-10-CM | POA: Diagnosis not present

## 2020-08-07 ENCOUNTER — Encounter: Payer: Self-pay | Admitting: Podiatry

## 2020-08-07 ENCOUNTER — Ambulatory Visit (INDEPENDENT_AMBULATORY_CARE_PROVIDER_SITE_OTHER): Payer: Medicare Other | Admitting: Podiatry

## 2020-08-07 ENCOUNTER — Other Ambulatory Visit: Payer: Self-pay

## 2020-08-07 DIAGNOSIS — E119 Type 2 diabetes mellitus without complications: Secondary | ICD-10-CM

## 2020-08-07 DIAGNOSIS — L84 Corns and callosities: Secondary | ICD-10-CM

## 2020-08-07 DIAGNOSIS — M79675 Pain in left toe(s): Secondary | ICD-10-CM

## 2020-08-07 DIAGNOSIS — M79674 Pain in right toe(s): Secondary | ICD-10-CM | POA: Diagnosis not present

## 2020-08-07 DIAGNOSIS — B351 Tinea unguium: Secondary | ICD-10-CM

## 2020-08-07 NOTE — Progress Notes (Signed)
This patient returns to my office for at risk foot care.  This patient requires this care by a professional since this patient will be at risk due to having diabetes.  This patient is unable to cut nails herself since the patient cannot reach her nails.These nails are painful walking and wearing shoes. She also has significant callus on medial border heels  B/L. This patient presents for at risk foot care today. She presents to the office with caregiver.  General Appearance  Alert, conversant and in no acute stress.  Vascular  Dorsalis pedis and posterior tibial  pulses are palpable right.  Dorsalis pedis and posterior tibial pulses are weakly palpable..  Capillary return is within normal limits  bilaterally. Temperature is within normal limits  bilaterally.  Neurologic  Senn-Weinstein monofilament wire test within normal limits  bilaterally. Muscle power within normal limits bilaterally.  Nails Thick disfigured discolored nails with subungual debris  Hallux  B/L.Marland Kitchen No evidence of bacterial infection or drainage bilaterally.  Orthopedic  No limitations of motion  feet .  No crepitus or effusions noted.  No bony pathology or digital deformities noted. Exostosis 1st MCJ  B/L.  Skin  normotropic skin with no porokeratosis noted bilaterally.  No signs of infections or ulcers noted.  Heel callus  B/L.   Onychomycosis  Pain in right toes  Pain in left toes  Consent was obtained for treatment procedures.   Mechanical debridement of nails 1-5  bilaterally performed with a nail nipper.  Filed with dremel without incident. Debride heel callus with dremel tool.  Told her to use pumice stone.   Return office visit    3 months                 Told patient to return for periodic foot care and evaluation due to potential at risk complications.   Gardiner Barefoot DPM

## 2020-09-12 ENCOUNTER — Other Ambulatory Visit: Payer: Self-pay | Admitting: Internal Medicine

## 2020-09-12 DIAGNOSIS — Z1231 Encounter for screening mammogram for malignant neoplasm of breast: Secondary | ICD-10-CM

## 2020-10-09 ENCOUNTER — Ambulatory Visit
Admission: RE | Admit: 2020-10-09 | Discharge: 2020-10-09 | Disposition: A | Discharge status: Home / Self Care | Payer: Medicare Other | Source: Ambulatory Visit | Attending: Internal Medicine | Admitting: Internal Medicine

## 2020-10-09 ENCOUNTER — Other Ambulatory Visit: Payer: Self-pay

## 2020-10-09 DIAGNOSIS — Z1231 Encounter for screening mammogram for malignant neoplasm of breast: Secondary | ICD-10-CM | POA: Diagnosis not present

## 2020-10-10 ENCOUNTER — Other Ambulatory Visit: Payer: Self-pay | Admitting: *Deleted

## 2020-10-10 ENCOUNTER — Inpatient Hospital Stay
Admission: RE | Admit: 2020-10-10 | Discharge: 2020-10-10 | Disposition: A | Discharge status: Home / Self Care | Payer: Self-pay | Source: Ambulatory Visit | Attending: *Deleted | Admitting: *Deleted

## 2020-10-10 DIAGNOSIS — Z1231 Encounter for screening mammogram for malignant neoplasm of breast: Secondary | ICD-10-CM

## 2020-10-25 DIAGNOSIS — R7303 Prediabetes: Secondary | ICD-10-CM | POA: Diagnosis not present

## 2020-10-25 DIAGNOSIS — E039 Hypothyroidism, unspecified: Secondary | ICD-10-CM | POA: Diagnosis not present

## 2020-10-25 DIAGNOSIS — D519 Vitamin B12 deficiency anemia, unspecified: Secondary | ICD-10-CM | POA: Diagnosis not present

## 2020-10-25 DIAGNOSIS — E559 Vitamin D deficiency, unspecified: Secondary | ICD-10-CM | POA: Diagnosis not present

## 2020-11-06 ENCOUNTER — Other Ambulatory Visit: Payer: Self-pay

## 2020-11-06 ENCOUNTER — Encounter: Payer: Self-pay | Admitting: Podiatry

## 2020-11-06 ENCOUNTER — Ambulatory Visit (INDEPENDENT_AMBULATORY_CARE_PROVIDER_SITE_OTHER): Payer: Medicare Other | Admitting: Podiatry

## 2020-11-06 DIAGNOSIS — M79674 Pain in right toe(s): Secondary | ICD-10-CM | POA: Diagnosis not present

## 2020-11-06 DIAGNOSIS — L84 Corns and callosities: Secondary | ICD-10-CM

## 2020-11-06 DIAGNOSIS — M79675 Pain in left toe(s): Secondary | ICD-10-CM | POA: Diagnosis not present

## 2020-11-06 DIAGNOSIS — B351 Tinea unguium: Secondary | ICD-10-CM

## 2020-11-06 DIAGNOSIS — E119 Type 2 diabetes mellitus without complications: Secondary | ICD-10-CM

## 2020-11-06 NOTE — Progress Notes (Signed)
This patient returns to my office for at risk foot care.  This patient requires this care by a professional since this patient will be at risk due to having diabetes.  This patient is unable to cut nails herself since the patient cannot reach her nails.These nails are painful walking and wearing shoes. She also has significant callus on medial border heels  B/L. This patient presents for at risk foot care today.   General Appearance  Alert, conversant and in no acute stress.  Vascular  Dorsalis pedis and posterior tibial  pulses are palpable right.  Dorsalis pedis and posterior tibial pulses are weakly palpable..  Capillary return is within normal limits  bilaterally. Temperature is within normal limits  bilaterally.  Neurologic  Senn-Weinstein monofilament wire test within normal limits  bilaterally. Muscle power within normal limits bilaterally.  Nails Thick disfigured discolored nails with subungual debris  Hallux  B/L.. No evidence of bacterial infection or drainage bilaterally.  Orthopedic  No limitations of motion  feet .  No crepitus or effusions noted.  No bony pathology or digital deformities noted. Exostosis 1st MCJ  B/L.  Skin  normotropic skin with no porokeratosis noted bilaterally.  No signs of infections or ulcers noted.  Heel callus  B/L.   Onychomycosis  Pain in right toes  Pain in left toes  Heel callus  B/L.  Consent was obtained for treatment procedures.   Mechanical debridement of nails 1-5  bilaterally performed with a nail nipper.  Filed with dremel without incident. Debride heel callus with dremel tool.  Told her to use pumice stone.   Return office visit    4 months                 Told patient to return for periodic foot care and evaluation due to potential at risk complications.   Sherron Mummert DPM  

## 2020-11-13 DIAGNOSIS — E041 Nontoxic single thyroid nodule: Secondary | ICD-10-CM | POA: Diagnosis not present

## 2020-11-13 DIAGNOSIS — Z112 Encounter for screening for other bacterial diseases: Secondary | ICD-10-CM | POA: Diagnosis not present

## 2020-11-13 DIAGNOSIS — E119 Type 2 diabetes mellitus without complications: Secondary | ICD-10-CM | POA: Diagnosis not present

## 2020-11-13 DIAGNOSIS — N390 Urinary tract infection, site not specified: Secondary | ICD-10-CM | POA: Diagnosis not present

## 2020-11-13 DIAGNOSIS — R300 Dysuria: Secondary | ICD-10-CM | POA: Diagnosis not present

## 2020-11-13 DIAGNOSIS — K219 Gastro-esophageal reflux disease without esophagitis: Secondary | ICD-10-CM | POA: Diagnosis not present

## 2020-11-13 DIAGNOSIS — I10 Essential (primary) hypertension: Secondary | ICD-10-CM | POA: Diagnosis not present

## 2020-12-12 DIAGNOSIS — R9431 Abnormal electrocardiogram [ECG] [EKG]: Secondary | ICD-10-CM | POA: Diagnosis not present

## 2020-12-12 DIAGNOSIS — I10 Essential (primary) hypertension: Secondary | ICD-10-CM | POA: Diagnosis not present

## 2020-12-12 DIAGNOSIS — I361 Nonrheumatic tricuspid (valve) insufficiency: Secondary | ICD-10-CM | POA: Diagnosis not present

## 2020-12-12 DIAGNOSIS — I517 Cardiomegaly: Secondary | ICD-10-CM | POA: Diagnosis not present

## 2020-12-12 DIAGNOSIS — I6523 Occlusion and stenosis of bilateral carotid arteries: Secondary | ICD-10-CM | POA: Diagnosis not present

## 2020-12-12 DIAGNOSIS — E119 Type 2 diabetes mellitus without complications: Secondary | ICD-10-CM | POA: Diagnosis not present

## 2020-12-27 DIAGNOSIS — Z79899 Other long term (current) drug therapy: Secondary | ICD-10-CM | POA: Diagnosis not present

## 2021-02-08 ENCOUNTER — Ambulatory Visit: Payer: Medicare Other | Admitting: Podiatry

## 2021-02-15 DIAGNOSIS — R000 Tachycardia, unspecified: Secondary | ICD-10-CM | POA: Diagnosis not present

## 2021-02-15 DIAGNOSIS — R7989 Other specified abnormal findings of blood chemistry: Secondary | ICD-10-CM | POA: Diagnosis not present

## 2021-02-15 DIAGNOSIS — G603 Idiopathic progressive neuropathy: Secondary | ICD-10-CM | POA: Diagnosis not present

## 2021-02-15 DIAGNOSIS — E7800 Pure hypercholesterolemia, unspecified: Secondary | ICD-10-CM | POA: Diagnosis not present

## 2021-02-15 DIAGNOSIS — E1159 Type 2 diabetes mellitus with other circulatory complications: Secondary | ICD-10-CM | POA: Diagnosis not present

## 2021-02-15 DIAGNOSIS — E119 Type 2 diabetes mellitus without complications: Secondary | ICD-10-CM | POA: Diagnosis not present

## 2021-02-15 DIAGNOSIS — E118 Type 2 diabetes mellitus with unspecified complications: Secondary | ICD-10-CM | POA: Diagnosis not present

## 2021-02-15 DIAGNOSIS — Z13220 Encounter for screening for lipoid disorders: Secondary | ICD-10-CM | POA: Diagnosis not present

## 2021-02-15 DIAGNOSIS — Z8249 Family history of ischemic heart disease and other diseases of the circulatory system: Secondary | ICD-10-CM | POA: Diagnosis not present

## 2021-02-15 DIAGNOSIS — Z79899 Other long term (current) drug therapy: Secondary | ICD-10-CM | POA: Diagnosis not present

## 2021-02-15 DIAGNOSIS — I10 Essential (primary) hypertension: Secondary | ICD-10-CM | POA: Diagnosis not present

## 2021-02-15 DIAGNOSIS — E039 Hypothyroidism, unspecified: Secondary | ICD-10-CM | POA: Diagnosis not present

## 2021-02-15 DIAGNOSIS — I739 Peripheral vascular disease, unspecified: Secondary | ICD-10-CM | POA: Diagnosis not present

## 2021-02-15 DIAGNOSIS — K219 Gastro-esophageal reflux disease without esophagitis: Secondary | ICD-10-CM | POA: Diagnosis not present

## 2021-02-15 DIAGNOSIS — E1142 Type 2 diabetes mellitus with diabetic polyneuropathy: Secondary | ICD-10-CM | POA: Diagnosis not present

## 2021-02-15 DIAGNOSIS — E559 Vitamin D deficiency, unspecified: Secondary | ICD-10-CM | POA: Diagnosis not present

## 2021-03-15 ENCOUNTER — Encounter (INDEPENDENT_AMBULATORY_CARE_PROVIDER_SITE_OTHER): Payer: Self-pay

## 2021-03-15 ENCOUNTER — Other Ambulatory Visit: Payer: Self-pay

## 2021-03-15 ENCOUNTER — Encounter: Payer: Self-pay | Admitting: Podiatry

## 2021-03-15 ENCOUNTER — Ambulatory Visit (INDEPENDENT_AMBULATORY_CARE_PROVIDER_SITE_OTHER): Payer: Medicare Other | Admitting: Podiatry

## 2021-03-15 DIAGNOSIS — E119 Type 2 diabetes mellitus without complications: Secondary | ICD-10-CM | POA: Diagnosis not present

## 2021-03-15 DIAGNOSIS — B351 Tinea unguium: Secondary | ICD-10-CM

## 2021-03-15 DIAGNOSIS — M79674 Pain in right toe(s): Secondary | ICD-10-CM | POA: Diagnosis not present

## 2021-03-15 DIAGNOSIS — L84 Corns and callosities: Secondary | ICD-10-CM

## 2021-03-15 DIAGNOSIS — M79675 Pain in left toe(s): Secondary | ICD-10-CM

## 2021-03-15 NOTE — Progress Notes (Signed)
This patient returns to my office for at risk foot care.  This patient requires this care by a professional since this patient will be at risk due to having diabetes.  This patient is unable to cut nails herself since the patient cannot reach her nails.These nails are painful walking and wearing shoes. She also has significant callus on medial border heels  B/L. This patient presents for at risk foot care today.   General Appearance  Alert, conversant and in no acute stress.  Vascular  Dorsalis pedis and posterior tibial  pulses are palpable right.  Dorsalis pedis and posterior tibial pulses are weakly palpable..  Capillary return is within normal limits  bilaterally. Temperature is within normal limits  bilaterally.  Neurologic  Senn-Weinstein monofilament wire test within normal limits  bilaterally. Muscle power within normal limits bilaterally.  Nails Thick disfigured discolored nails with subungual debris  Hallux  B/L.. No evidence of bacterial infection or drainage bilaterally.  Orthopedic  No limitations of motion  feet .  No crepitus or effusions noted.  No bony pathology or digital deformities noted. Exostosis 1st MCJ  B/L.  Skin  normotropic skin with no porokeratosis noted bilaterally.  No signs of infections or ulcers noted.  Heel callus  B/L.   Onychomycosis  Pain in right toes  Pain in left toes  Heel callus  B/L.  Consent was obtained for treatment procedures.   Mechanical debridement of nails 1-5  bilaterally performed with a nail nipper.  Filed with dremel without incident. Debride heel callus with dremel tool.  Told her to use pumice stone.   Return office visit    4 months                 Told patient to return for periodic foot care and evaluation due to potential at risk complications.   Kemal Amores DPM  

## 2021-05-02 ENCOUNTER — Other Ambulatory Visit: Payer: Self-pay | Admitting: Nephrology

## 2021-05-02 DIAGNOSIS — N1831 Chronic kidney disease, stage 3a: Secondary | ICD-10-CM

## 2021-05-02 DIAGNOSIS — E1122 Type 2 diabetes mellitus with diabetic chronic kidney disease: Secondary | ICD-10-CM

## 2021-05-16 ENCOUNTER — Other Ambulatory Visit: Payer: Self-pay

## 2021-05-16 ENCOUNTER — Ambulatory Visit
Admission: RE | Admit: 2021-05-16 | Discharge: 2021-05-16 | Disposition: A | Discharge status: Home / Self Care | Payer: Medicare Other | Source: Ambulatory Visit | Attending: Nephrology | Admitting: Nephrology

## 2021-05-16 DIAGNOSIS — E1122 Type 2 diabetes mellitus with diabetic chronic kidney disease: Secondary | ICD-10-CM | POA: Insufficient documentation

## 2021-05-16 DIAGNOSIS — N1831 Chronic kidney disease, stage 3a: Secondary | ICD-10-CM | POA: Diagnosis present

## 2021-05-17 ENCOUNTER — Ambulatory Visit (INDEPENDENT_AMBULATORY_CARE_PROVIDER_SITE_OTHER): Payer: Medicare Other | Admitting: Dermatology

## 2021-05-17 DIAGNOSIS — B078 Other viral warts: Secondary | ICD-10-CM | POA: Diagnosis not present

## 2021-05-17 DIAGNOSIS — D229 Melanocytic nevi, unspecified: Secondary | ICD-10-CM | POA: Diagnosis not present

## 2021-05-17 DIAGNOSIS — L821 Other seborrheic keratosis: Secondary | ICD-10-CM | POA: Diagnosis not present

## 2021-05-17 DIAGNOSIS — D485 Neoplasm of uncertain behavior of skin: Secondary | ICD-10-CM

## 2021-05-17 MED ORDER — TRIAMCINOLONE ACETONIDE 0.1 % EX CREA: 1.0000 "application " | TOPICAL_CREAM | Freq: Two times a day (BID) | CUTANEOUS | 11 refills | Status: DC | PRN

## 2021-05-17 MED ORDER — MUPIROCIN 2 % EX OINT: 1.0000 "application " | TOPICAL_OINTMENT | Freq: Every day | CUTANEOUS | 0 refills | Status: AC

## 2021-05-17 MED ORDER — TRIAMCINOLONE ACETONIDE 0.1 % EX CREA: 1.0000 "application " | TOPICAL_CREAM | Freq: Two times a day (BID) | CUTANEOUS | 0 refills | Status: AC | PRN

## 2021-05-17 NOTE — Progress Notes (Signed)
New Patient Visit  Subjective  Vanessa Garza is a 45 y.o. female who presents for the following: Skin Problem (Patient here today for a few spots at neck and face. No itching, not bothersome for patient. ).  Patient accompanied by caregiver.   The following portions of the chart were reviewed this encounter and updated as appropriate:   Tobacco  Allergies  Meds  Problems  Med Hx  Surg Hx  Fam Hx      Review of Systems:  No other skin or systemic complaints except as noted in HPI or Assessment and Plan.  Objective  Well appearing patient in no apparent distress; mood and affect are within normal limits.  A focused examination was performed including neck, face. Relevant physical exam findings are noted in the Assessment and Plan.  right neck x 3, left neck x 1 (4) Verrucous papules -- Discussed viral etiology and contagion.   left neck 0.8 cm verrucous papule     right lateral neck 0.8 cm verrucous papule     anterior neck 0.5 cm verrucous papule     right cheek 0.3 cm verrucous papule      Assessment & Plan  Other viral warts (4) right neck x 3, left neck x 1  Discussed viral etiology and risk of spread.  Discussed multiple treatments may be required to clear warts.  Discussed possible post-treatment dyspigmentation and risk of recurrence.  Squaric Acid 3% applied to warts today. Prior to application reviewed risk of inflammation and irritation.  Cantharidin Plus is a blistering agent that comes from a beetle.  It needs to be washed off in about 4 hours after application.  Although it is painless when applied in office, it may cause symptoms of mild pain and burning several hours later.  Treated areas will swell and turn red, and blisters may form.  Vaseline and a bandaid may be applied until wound has healed.  Once healed, the skin may remain temporarily discolored.  It can take weeks to months for pigmentation to return to normal.  Advised to wash  off with soap and water in 4 hours or sooner if it becomes tender before then.     Destruction of lesion - right neck x 3, left neck x 1  Destruction method: chemical removal   Informed consent: discussed and consent obtained   Timeout:  patient name, date of birth, surgical site, and procedure verified Chemical destruction method: cantharidin   Chemical destruction method comment:  Cantharidin plus Application time:  4 hours Procedure instructions: patient instructed to wash and dry area   Outcome: patient tolerated procedure well with no complications   Post-procedure details: wound care instructions given    Related Medications triamcinolone cream (KENALOG) 0.1 % Apply 1 application topically 2 (two) times daily as needed. For up to 1 week. Avoid applying to face, groin, and axilla. Use as directed. Long-term use can cause thinning of the skin.  Neoplasm of uncertain behavior of skin (4) left neck  Epidermal / dermal shaving  Lesion diameter (cm):  0.8 Informed consent: discussed and consent obtained   Timeout: patient name, date of birth, surgical site, and procedure verified   Patient was prepped and draped in usual sterile fashion: area prepped with isopropyl alcohol. Anesthesia: the lesion was anesthetized in a standard fashion   Anesthetic:  1% lidocaine w/ epinephrine 1-100,000 buffered w/ 8.4% NaHCO3 Instrument used: flexible razor blade and scissors   Hemostasis achieved with: pressure, aluminum chloride and  electrodesiccation   Outcome: patient tolerated procedure well   Post-procedure details: wound care instructions given   Additional details:  Mupirocin and a bandage applied  mupirocin ointment (BACTROBAN) 2 % Apply 1 application topically daily. With dressing changes  Specimen 1 - Surgical pathology Differential Diagnosis: r/o verruca vs other  Check Margins: No 0.8 cm verrucous papule  right lateral neck  Epidermal / dermal shaving  Lesion diameter  (cm):  0.8 Informed consent: discussed and consent obtained   Timeout: patient name, date of birth, surgical site, and procedure verified   Patient was prepped and draped in usual sterile fashion: area prepped with isopropyl alcohol. Anesthesia: the lesion was anesthetized in a standard fashion   Anesthetic:  1% lidocaine w/ epinephrine 1-100,000 buffered w/ 8.4% NaHCO3 Instrument used: flexible razor blade   Hemostasis achieved with: aluminum chloride   Outcome: patient tolerated procedure well   Post-procedure details: wound care instructions given   Additional details:  Mupirocin and a bandage applied  Specimen 2 - Surgical pathology Differential Diagnosis: r/o verruca vs other  Check Margins: No 0.8 cm verrucous papule  anterior neck  Epidermal / dermal shaving  Lesion diameter (cm):  0.5 Informed consent: discussed and consent obtained   Timeout: patient name, date of birth, surgical site, and procedure verified   Patient was prepped and draped in usual sterile fashion: area prepped with isopropyl alcohol. Anesthesia: the lesion was anesthetized in a standard fashion   Anesthetic:  1% lidocaine w/ epinephrine 1-100,000 buffered w/ 8.4% NaHCO3 Instrument used: flexible razor blade   Hemostasis achieved with: aluminum chloride   Outcome: patient tolerated procedure well   Post-procedure details: wound care instructions given   Additional details:  Mupirocin and a bandage applied  Specimen 3 - Surgical pathology Differential Diagnosis: r/o verruca vs other  Check Margins: No 0.5 cm verrucous papule  right cheek  Epidermal / dermal shaving  Lesion diameter (cm):  0.3 Informed consent: discussed and consent obtained   Timeout: patient name, date of birth, surgical site, and procedure verified   Patient was prepped and draped in usual sterile fashion: area prepped with isopropyl alcohol. Anesthesia: the lesion was anesthetized in a standard fashion   Anesthetic:  1%  lidocaine w/ epinephrine 1-100,000 buffered w/ 8.4% NaHCO3 Instrument used: flexible razor blade   Hemostasis achieved with: aluminum chloride   Outcome: patient tolerated procedure well   Post-procedure details: wound care instructions given   Additional details:  Mupirocin and a bandage applied  Specimen 4 - Surgical pathology Differential Diagnosis: r/o verruca vs other  Check Margins: No 0.3 cm verrucous papule  Seborrheic Keratoses - Stuck-on, waxy, tan-brown papules and/or plaques  - Benign-appearing - Discussed benign etiology and prognosis. - Observe - Call for any changes  Melanocytic Nevi - Tan-brown and/or pink-flesh-colored symmetric macules and papules - Benign appearing on exam today - Observation - Call clinic for new or changing moles - Recommend daily use of broad spectrum spf 30+ sunscreen to sun-exposed areas.   Return in about 6 weeks (around 06/28/2021) for Wart.  Graciella Belton, RMA, am acting as scribe for Forest Gleason, MD .  Documentation: I have reviewed the above documentation for accuracy and completeness, and I agree with the above.  Forest Gleason, MD

## 2021-05-17 NOTE — Patient Instructions (Addendum)
Wound Care Instructions  Cleanse wound gently with soap and water once a day then pat dry with clean gauze. Apply a thing coat of Petrolatum (petroleum jelly, "Vaseline") over the wound (unless you have an allergy to this). We recommend that you use a new, sterile tube of Vaseline. Do not pick or remove scabs. Do not remove the yellow or white "healing tissue" from the base of the wound.  Cover the wound with fresh, clean, nonstick gauze and secure with paper tape. You may use Band-Aids in place of gauze and tape if the would is small enough, but would recommend trimming much of the tape off as there is often too much. Sometimes Band-Aids can irritate the skin.  You should call the office for your biopsy report after 1 week if you have not already been contacted.  If you experience any problems, such as abnormal amounts of bleeding, swelling, significant bruising, significant pain, or evidence of infection, please call the office immediately.  Instructions for After In-Office Application of Cantharidin  1. This is a strong medicine; please follow ALL instructions.  2. Gently wash off with soap and water in four hours or sooner s directed by your physician.  3. **WARNING** this medicine can cause severe blistering, blood blisters, infection, and/or scarring if it is not washed off as directed.  4. Your progress will be rechecked in 1-2 months; call sooner if there are any questions or problems.  Cantharidin Plus is a blistering agent that comes from a beetle.  It needs to be washed off in about 4 hours after application.  Although it is painless when applied in office, it may cause symptoms of mild pain and burning several hours later.  Treated areas will swell and turn red, and blisters may form.  Vaseline and a bandaid may be applied until wound has healed.  Once healed, the skin may remain temporarily discolored.  It can take weeks to months for pigmentation to return to normal.  Advised to  wash off with soap and water in 4 hours or sooner if it becomes tender before then.  If You Need Anything After Your Visit  If you have any questions or concerns for your doctor, please call our main line at [phone] and press option 4 to reach your doctor's medical assistant. If no one answers, please leave a voicemail as directed and we will return your call as soon as possible. Messages left after 4 pm will be answered the following business day.   You may also send [place] a message via [place]. We typically respond to MyChart messages within 1-2 business days.  For prescription refills, please ask your pharmacy to contact our office. Our fax number is [phone].  If you have an urgent issue when the clinic is closed that cannot wait until the next business day, you can page your doctor at the number below.    Please note that while we do our best to be available for urgent issues outside of office hours, we are not available 24/7.   If you have an urgent issue and are unable to reach [place], you may choose to seek medical care at your doctor's office, retail clinic, urgent care center, or emergency room.  If you have a medical emergency, please immediately call 911 or go to the emergency department.  Pager Numbers  - Dr. Nehemiah Massed: [phone]  - Dr. Laurence Ferrari: [phone]  - Dr. Nicole Kindred: [phone]  In the event of inclement weather, please call our main  line at [phone] for an update on the status of any delays or closures.  Dermatology Medication Tips: Please keep the boxes that topical medications come in in order to help keep track of the instructions about where and how to use these. Pharmacies typically print the medication instructions only on the boxes and not directly on the medication tubes.   If your medication is too expensive, please contact our office at [phone] option 4 or send [place] a message through [place].   We are unable to tell what your co-pay for  medications will be in advance as this is different depending on your insurance coverage. However, we may be able to find a substitute medication at lower cost or fill out paperwork to get insurance to cover a needed medication.   If a prior authorization is required to get your medication covered by your insurance company, please allow [place] 1-2 business days to complete this process.  Drug prices often vary depending on where the prescription is filled and some pharmacies may offer cheaper prices.  The website www.goodrx.com contains coupons for medications through different pharmacies. The prices here do not account for what the cost may be with help from insurance (it may be cheaper with your insurance), but the website can give you the price if you did not use any insurance.  - You can print the associated coupon and take it with your prescription to the pharmacy.  - You may also stop by our office during regular business hours and pick up a GoodRx coupon card.  - If you need your prescription sent electronically to a different pharmacy, notify our office through [facility] or by phone at [phone] option 4.     Si Usted Necesita Algo Despus de Su Visita  Tambin puede enviarnos un mensaje a travs de Pharmacist, community. Por lo general respondemos a los mensajes de MyChart en el transcurso de 1 a 2 das hbiles.  Para renovar recetas, por favor pida a su farmacia que se ponga en contacto con nuestra oficina. Harland Dingwall de fax es [place] [phone].  Si tiene un asunto urgente cuando la clnica est cerrada y que no puede esperar hasta el siguiente da hbil, puede llamar/localizar a su doctor(a) al nmero que aparece a continuacin.   Por favor, tenga en cuenta que aunque hacemos todo lo posible para estar disponibles para asuntos urgentes fuera del horario de [place], no estamos disponibles las 24 horas del da, los 7 das de la [place].   Si tiene un problema urgente y no puede  comunicarse con nosotros, puede optar por buscar atencin mdica  en el consultorio de su doctor(a), en una clnica privada, en un centro de atencin urgente o en una sala de emergencias.  Si tiene Engineering geologist, por favor llame inmediatamente al 911 o vaya a la sala de emergencias.  Nmeros de bper  - Dr. Nehemiah Massed: [phone]  - Dra. Moye: [phone]  - Dra. Nicole Kindred: [phone]  En caso de inclemencias del [place], por favor llame a Johnsie Kindred principal al [phone] para una actualizacin sobre el [place] de cualquier retraso o cierre.  Consejos para la medicacin en dermatologa: Por favor, guarde las cajas en las que vienen los medicamentos de uso tpico para ayudarle a seguir las instrucciones sobre dnde y cmo usarlos. Las farmacias generalmente imprimen las instrucciones del medicamento slo en las cajas y no directamente en los tubos del [place].   Si su medicamento es [employer], por favor, pngase en  contacto con Zigmund Daniel llamando al [phone] y presione la opcin 4 o envenos un mensaje a travs de Pharmacist, community.   No podemos decirle cul ser su copago por los medicamentos por adelantado ya que esto es diferente dependiendo de la cobertura de su seguro. Sin embargo, es posible que podamos encontrar un medicamento sustituto a Electrical engineer un formulario para que el seguro cubra el medicamento que se considera necesario.   Si se requiere una autorizacin previa para que su compaa de seguros [place] su medicamento, por favor permtanos de 1 a 2 das hbiles para completar este proceso.  Los precios de los medicamentos varan con frecuencia dependiendo del Environmental consultant de dnde se surte la receta y alguna farmacias pueden ofrecer precios ms baratos.  El sitio web www.goodrx.com tiene cupones para medicamentos de Airline pilot. Los precios aqu no tienen en cuenta lo que podra costar con la ayuda del seguro (puede ser ms barato con su seguro), pero  el sitio web puede darle el precio si no utiliz Research scientist (physical sciences).  - Puede imprimir el cupn correspondiente y llevarlo con su receta a la farmacia.  - Tambin puede pasar por nuestra oficina durante el horario de atencin regular y Charity fundraiser una tarjeta de cupones de GoodRx.  - Si necesita que su receta se enve electrnicamente a una farmacia diferente, informe a nuestra oficina a travs de MyChart de [place] o por telfono llamando al [phone] y presione la opcin 4.

## 2021-05-23 ENCOUNTER — Telehealth: Payer: Self-pay

## 2021-05-23 NOTE — Telephone Encounter (Signed)
-----   Message from Alfonso Patten, MD sent at 05/23/2021 11:50 AM EST ----- 1. Skin , left neck VERRUCA VULGARIS 2. Skin , right lateral neck VERRUCA VULGARIS 3. Skin , anterior neck VERRUCA VULGARIS 4. Skin , right cheek VERRUCA VULGARIS  This is a WART caused by the human papilloma virus. It is not dangerous but is contagious and can spread to other areas of skin or other people if it is not completely gone. No additional treatment is needed. However, if it comes back, we can freeze it in clinic with liquid nitrogen (a quick in office procedure) or you can also treat it at home with an over the counter salicylic wart treatment (slower).  Please call the office at [phone] or message [place] if you have have any questions.   MAs please call. Thank you!

## 2021-05-23 NOTE — Telephone Encounter (Signed)
Left message on voicemail to return my call.  

## 2021-05-28 ENCOUNTER — Telehealth: Payer: Self-pay

## 2021-05-28 NOTE — Telephone Encounter (Addendum)
  Tried calling patient, no answer. Left message for patient to call office  ----- Message from Alfonso Patten, MD sent at 05/23/2021 11:50 AM EST ----- 1. Skin , left neck VERRUCA VULGARIS 2. Skin , right lateral neck VERRUCA VULGARIS 3. Skin , anterior neck VERRUCA VULGARIS 4. Skin , right cheek VERRUCA VULGARIS  This is a WART caused by the human papilloma virus. It is not dangerous but is contagious and can spread to other areas of skin or other people if it is not completely gone. No additional treatment is needed. However, if it comes back, we can freeze it in clinic with liquid nitrogen (a quick in office procedure) or you can also treat it at home with an over the counter salicylic wart treatment (slower).  Please call the office at [phone] or message [place] if you have have any questions.   MAs please call. Thank you!

## 2021-05-29 ENCOUNTER — Encounter: Payer: Self-pay | Admitting: Dermatology

## 2021-06-06 ENCOUNTER — Telehealth: Payer: Self-pay

## 2021-06-06 NOTE — Telephone Encounter (Signed)
-----   Message from Alfonso Patten, MD sent at 05/23/2021 11:50 AM EST ----- 1. Skin , left neck VERRUCA VULGARIS 2. Skin , right lateral neck VERRUCA VULGARIS 3. Skin , anterior neck VERRUCA VULGARIS 4. Skin , right cheek VERRUCA VULGARIS  This is a WART caused by the human papilloma virus. It is not dangerous but is contagious and can spread to other areas of skin or other people if it is not completely gone. No additional treatment is needed. However, if it comes back, we can freeze it in clinic with liquid nitrogen (a quick in office procedure) or you can also treat it at home with an over the counter salicylic wart treatment (slower).  Please call the office at [phone] or message [place] if you have have any questions.   MAs please call. Thank you!

## 2021-06-13 ENCOUNTER — Telehealth: Payer: Self-pay

## 2021-06-13 NOTE — Telephone Encounter (Addendum)
Patient returned call regarding results. Went over results with patient. Patient verbalized results and denied further questions at this time.     ----- Message from Alfonso Patten, MD sent at 05/23/2021 11:50 AM EST ----- 1. Skin , left neck VERRUCA VULGARIS 2. Skin , right lateral neck VERRUCA VULGARIS 3. Skin , anterior neck VERRUCA VULGARIS 4. Skin , right cheek VERRUCA VULGARIS  This is a WART caused by the human papilloma virus. It is not dangerous but is contagious and can spread to other areas of skin or other people if it is not completely gone. No additional treatment is needed. However, if it comes back, we can freeze it in clinic with liquid nitrogen (a quick in office procedure) or you can also treat it at home with an over the counter salicylic wart treatment (slower).  Please call the office at [phone] or message [place] if you have have any questions.   MAs please call. Thank you!

## 2021-06-13 NOTE — Telephone Encounter (Addendum)
°  Patient called regarding biopsy results. No answer. Left message for patient to return call.    ----- Message from Alfonso Patten, MD sent at 05/23/2021 11:50 AM EST ----- 1. Skin , left neck VERRUCA VULGARIS 2. Skin , right lateral neck VERRUCA VULGARIS 3. Skin , anterior neck VERRUCA VULGARIS 4. Skin , right cheek VERRUCA VULGARIS  This is a WART caused by the human papilloma virus. It is not dangerous but is contagious and can spread to other areas of skin or other people if it is not completely gone. No additional treatment is needed. However, if it comes back, we can freeze it in clinic with liquid nitrogen (a quick in office procedure) or you can also treat it at home with an over the counter salicylic wart treatment (slower).  Please call the office at [phone] or message [place] if you have have any questions.   MAs please call. Thank you!

## 2021-06-14 ENCOUNTER — Ambulatory Visit: Payer: Medicare Other | Admitting: Podiatry

## 2021-06-28 ENCOUNTER — Ambulatory Visit: Payer: Medicare Other | Admitting: Dermatology

## 2021-07-30 ENCOUNTER — Ambulatory Visit: Payer: Medicare Other | Admitting: Podiatry

## 2021-08-01 ENCOUNTER — Telehealth: Payer: Self-pay

## 2021-08-01 NOTE — Telephone Encounter (Signed)
Error

## 2021-09-14 ENCOUNTER — Other Ambulatory Visit: Payer: Self-pay | Admitting: Adult Health

## 2021-09-14 DIAGNOSIS — Z1231 Encounter for screening mammogram for malignant neoplasm of breast: Secondary | ICD-10-CM

## 2021-11-12 ENCOUNTER — Inpatient Hospital Stay: Admission: RE | Admit: 2021-11-12 | Payer: Medicare Other | Source: Ambulatory Visit

## 2021-11-22 ENCOUNTER — Ambulatory Visit
Admission: RE | Admit: 2021-11-22 | Discharge: 2021-11-22 | Disposition: A | Discharge status: Home / Self Care | Payer: Medicare Other | Source: Ambulatory Visit | Attending: Adult Health | Admitting: Adult Health

## 2021-11-22 ENCOUNTER — Ambulatory Visit: Payer: Medicare Other

## 2021-11-22 DIAGNOSIS — Z1231 Encounter for screening mammogram for malignant neoplasm of breast: Secondary | ICD-10-CM | POA: Insufficient documentation

## 2022-04-01 ENCOUNTER — Ambulatory Visit (INDEPENDENT_AMBULATORY_CARE_PROVIDER_SITE_OTHER): Payer: Medicare Other | Admitting: Podiatry

## 2022-04-01 ENCOUNTER — Encounter: Payer: Self-pay | Admitting: Podiatry

## 2022-04-01 DIAGNOSIS — L84 Corns and callosities: Secondary | ICD-10-CM

## 2022-04-01 DIAGNOSIS — E119 Type 2 diabetes mellitus without complications: Secondary | ICD-10-CM

## 2022-04-01 DIAGNOSIS — B351 Tinea unguium: Secondary | ICD-10-CM | POA: Diagnosis not present

## 2022-04-01 DIAGNOSIS — M79674 Pain in right toe(s): Secondary | ICD-10-CM | POA: Diagnosis not present

## 2022-04-01 DIAGNOSIS — M79675 Pain in left toe(s): Secondary | ICD-10-CM

## 2022-04-01 MED ORDER — AMMONIUM LACTATE 12 % EX CREA: 1.0000 | TOPICAL_CREAM | Freq: Two times a day (BID) | CUTANEOUS | 3 refills | Status: AC

## 2022-04-04 NOTE — Progress Notes (Signed)
  Subjective:  Patient ID: Vanessa Garza, female    DOB: 09/07/1975,  MRN: [id number]  Chief Complaint  Patient presents with   Nail Problem    "Clip her toenails."   Callouses    "She has those spots on the sides of her feet."    46 y.o. female presents with the above complaint. History confirmed with patient.  She was in a group home she is here with a caretaker with her  Objective:  Physical Exam: warm, good capillary refill, no trophic changes or ulcerative lesions, normal DP and PT pulses, normal sensory exam, and bilateral heel callus diffuse.  No evidence of pre-ulceration Left Foot: dystrophic yellowed discolored nail plates with subungual debris Right Foot: dystrophic yellowed discolored nail plates with subungual debris  Assessment:   1. Heel callus   2. Type 2 diabetes mellitus without complication, without long-term current use of insulin (HCC)   3. Pain due to onychomycosis of toenails of both feet      Plan:  Patient was evaluated and treated and all questions answered.  Patient educated on diabetes. Discussed proper diabetic foot care and discussed risks and complications of disease. Educated patient in depth on reasons to return to the office immediately should he/she discover anything concerning or new on the feet. All questions answered. Discussed proper shoes as well.   We discussed preventative and palliative care of callused lesions including supportive and accommodative shoegear, padding, prefabricated and custom molded accommodative orthoses, use of a pumice stone and lotions/creams daily.  Rx for ammonium lactate cream sent to pharmacy  Discussed the etiology and treatment options for the condition in detail with the patient. Educated patient on the topical and oral treatment options for mycotic nails. Recommended debridement of the nails today. Sharp and mechanical debridement performed of all painful and mycotic nails today. Nails debrided in length and  thickness using a nail nipper to level of comfort. Discussed treatment options including appropriate shoe gear. Follow up as needed for painful nails.     Return in about 3 months (around 07/02/2022) for at risk diabetic foot care.

## 2022-07-04 ENCOUNTER — Encounter: Payer: Self-pay | Admitting: Podiatry

## 2022-07-04 ENCOUNTER — Ambulatory Visit (INDEPENDENT_AMBULATORY_CARE_PROVIDER_SITE_OTHER): Payer: Medicare Other | Admitting: Podiatry

## 2022-07-04 VITALS — BP 126/66 | HR 70

## 2022-07-04 DIAGNOSIS — L84 Corns and callosities: Secondary | ICD-10-CM

## 2022-07-04 DIAGNOSIS — M79675 Pain in left toe(s): Secondary | ICD-10-CM

## 2022-07-04 DIAGNOSIS — B351 Tinea unguium: Secondary | ICD-10-CM | POA: Diagnosis not present

## 2022-07-04 DIAGNOSIS — M79674 Pain in right toe(s): Secondary | ICD-10-CM

## 2022-07-04 DIAGNOSIS — E119 Type 2 diabetes mellitus without complications: Secondary | ICD-10-CM

## 2022-07-04 NOTE — Progress Notes (Signed)
This patient returns to my office for at risk foot care.  This patient requires this care by a professional since this patient will be at risk due to having diabetes.  This patient is unable to cut nails herself since the patient cannot reach her nails.These nails are painful walking and wearing shoes. She also has significant callus on medial border heels  B/L. This patient presents for at risk foot care today.   General Appearance  Alert, conversant and in no acute stress.  Vascular  Dorsalis pedis and posterior tibial  pulses are palpable right.  Dorsalis pedis and posterior tibial pulses are weakly palpable..  Capillary return is within normal limits  bilaterally. Temperature is within normal limits  bilaterally.  Neurologic  Senn-Weinstein monofilament wire test within normal limits  bilaterally. Muscle power within normal limits bilaterally.  Nails Thick disfigured discolored nails with subungual debris  Hallux  B/L.Marland Kitchen No evidence of bacterial infection or drainage bilaterally.  Orthopedic  No limitations of motion  feet .  No crepitus or effusions noted.  No bony pathology or digital deformities noted. Exostosis 1st MCJ  B/L.  Skin  normotropic skin with no porokeratosis noted bilaterally.  No signs of infections or ulcers noted.  Heel callus  B/L.   Onychomycosis  Pain in right toes  Pain in left toes  Heel callus  B/L.  Consent was obtained for treatment procedures.   Mechanical debridement of nails 1-5  bilaterally performed with a nail nipper.  Filed with dremel without incident. Debride heel callus with dremel tool.  Told her to use pumice stone.   Return office visit    4 months                 Told patient to return for periodic foot care and evaluation due to potential at risk complications.   Gardiner Barefoot DPM

## 2022-10-03 ENCOUNTER — Ambulatory Visit: Payer: Medicare Other | Admitting: Podiatry

## 2022-10-10 ENCOUNTER — Ambulatory Visit (INDEPENDENT_AMBULATORY_CARE_PROVIDER_SITE_OTHER): Payer: 59 | Admitting: Podiatry

## 2022-10-10 ENCOUNTER — Encounter: Payer: Self-pay | Admitting: Podiatry

## 2022-10-10 VITALS — BP 113/68 | HR 100

## 2022-10-10 DIAGNOSIS — E119 Type 2 diabetes mellitus without complications: Secondary | ICD-10-CM | POA: Diagnosis not present

## 2022-10-10 DIAGNOSIS — M79674 Pain in right toe(s): Secondary | ICD-10-CM | POA: Diagnosis not present

## 2022-10-10 DIAGNOSIS — M79675 Pain in left toe(s): Secondary | ICD-10-CM | POA: Diagnosis not present

## 2022-10-10 DIAGNOSIS — B351 Tinea unguium: Secondary | ICD-10-CM | POA: Diagnosis not present

## 2022-10-10 DIAGNOSIS — L84 Corns and callosities: Secondary | ICD-10-CM | POA: Diagnosis not present

## 2022-10-10 NOTE — Progress Notes (Signed)
This patient returns to my office for at risk foot care.  This patient requires this care by a professional since this patient will be at risk due to having diabetes.  This patient is unable to cut nails herself since the patient cannot reach her nails.These nails are painful walking and wearing shoes. She also has significant callus on medial border heels  B/L. This patient presents for at risk foot care today.   General Appearance  Alert, conversant and in no acute stress.  Vascular  Dorsalis pedis and posterior tibial  pulses are palpable right.  Dorsalis pedis and posterior tibial pulses are weakly palpable..  Capillary return is within normal limits  bilaterally. Temperature is within normal limits  bilaterally.  Neurologic  Senn-Weinstein monofilament wire test within normal limits  bilaterally. Muscle power within normal limits bilaterally.  Nails Thick disfigured discolored nails with subungual debris  Hallux  B/L.Marland Kitchen No evidence of bacterial infection or drainage bilaterally.  Orthopedic  No limitations of motion  feet .  No crepitus or effusions noted.  No bony pathology or digital deformities noted. Exostosis 1st MCJ  B/L.  Skin  normotropic skin with no porokeratosis noted bilaterally.  No signs of infections or ulcers noted.  Heel callus  B/L.   Onychomycosis  Pain in right toes  Pain in left toes  Heel callus  B/L.  Consent was obtained for treatment procedures.   Mechanical debridement of nails 1-5  bilaterally performed with a nail nipper.  Filed with dremel without incident. Debride heel callus with dremel tool.  Told her to use pumice stone.   Return office visit    3 months                 Told patient to return for periodic foot care and evaluation due to potential at risk complications.   Helane Gunther DPM

## 2023-01-16 ENCOUNTER — Ambulatory Visit (INDEPENDENT_AMBULATORY_CARE_PROVIDER_SITE_OTHER): Payer: 59 | Admitting: Podiatry

## 2023-01-16 VITALS — BP 148/99

## 2023-01-16 DIAGNOSIS — E119 Type 2 diabetes mellitus without complications: Secondary | ICD-10-CM

## 2023-01-16 DIAGNOSIS — M79674 Pain in right toe(s): Secondary | ICD-10-CM | POA: Diagnosis not present

## 2023-01-16 DIAGNOSIS — M79675 Pain in left toe(s): Secondary | ICD-10-CM | POA: Diagnosis not present

## 2023-01-16 DIAGNOSIS — B351 Tinea unguium: Secondary | ICD-10-CM | POA: Diagnosis not present

## 2023-01-16 DIAGNOSIS — L84 Corns and callosities: Secondary | ICD-10-CM | POA: Diagnosis not present

## 2023-01-23 ENCOUNTER — Encounter: Payer: Self-pay | Admitting: Podiatry

## 2023-01-23 NOTE — Progress Notes (Signed)
  Subjective:  Patient ID: Vanessa Garza, female    DOB: 1975/12/07,  MRN: [id number]  Vanessa Garza presents to clinic today for preventative diabetic foot care and callus(es) bilateral heels and painful thick toenails that are difficult to trim. Painful toenails interfere with ambulation. Aggravating factors include wearing enclosed shoe gear. Pain is relieved with periodic professional debridement. Painful calluses are aggravated when weightbearing with and without shoegear. Pain is relieved with periodic professional debridement. She resides at [facility]. Chief Complaint  Patient presents with   Nail Problem    DFC,Referring Provider Rexene Agent, MD,lov:7/24,A1C:5.9      New problem(s): None.   PCP is Rexene Agent, MD.  No Known Allergies  Review of Systems: Negative except as noted in the HPI.  Objective: No changes noted in today's physical examination. Vitals:   01/16/23 1500  BP: (!) 148/99   Vanessa Garza is a pleasant 47 y.o. female in NAD. AAO x 3. Vascular Examination: CFT <3 seconds b/l. DP/PT pulses palpable right LE; DP/PT pulses faintly palpable LLE. Digital hair absent. Skin temperature gradient warm to warm b/l. No pain with calf compression. No ischemia or gangrene. No cyanosis or clubbing noted b/l.    Neurological Examination: Sensation grossly intact b/l with 10 gram monofilament. Vibratory sensation intact b/l.   Dermatological Examination: Pedal skin warm and supple b/l. No open wounds b/l. No interdigital macerations. Toenails 1-5 b/l thick, discolored, elongated with subungual debris and pain on dorsal palpation.  Hyperkeratotic lesion(s) bilateral heels.  No erythema, no edema, no drainage, no fluctuance.  Musculoskeletal Examination: Muscle strength 5/5 to all lower extremity muscle groups bilaterally. Exostosis 1st metatarsocuneiform joint b/l.  Radiographs: None  Assessment/Plan: 1. Pain due to onychomycosis of toenails of  both feet   2. Heel callus   3. Type 2 diabetes mellitus without complication, without long-term current use of insulin (HCC)   -Patient was evaluated and treated. All patient's and/or POA's questions/concerns answered on today's visit. -Caregiver/provider present with patient on today's visit. -Continue foot and shoe inspections daily. Monitor blood glucose per PCP/Endocrinologist's recommendations. -Patient to continue soft, supportive shoe gear daily. -Toenails 1-5 b/l were debrided in length and girth with sterile nail nippers and dremel without iatrogenic bleeding.  -Callus(es) bilateral heels pared utilizing sterile scalpel blade without complication or incident. Total number debrided =2. -Patient/POA to call should there be question/concern in the interim.   Return in about 3 months (around 04/18/2023).  Freddie Breech, DPM

## 2023-01-29 IMAGING — MG MM DIGITAL SCREENING BILAT W/ TOMO AND CAD
8 series · 8 of 24 positions shown · non-contrast
Comparison: Previous exam(s).

CLINICAL DATA: Screening.

EXAM:
DIGITAL SCREENING BILATERAL MAMMOGRAM WITH TOMOSYNTHESIS AND CAD
TECHNIQUE: Bilateral screening digital craniocaudal and mediolateral oblique
mammograms were obtained. Bilateral screening digital breast
tomosynthesis was performed. The images were evaluated with
computer-aided detection.

[L CC synth-2D]
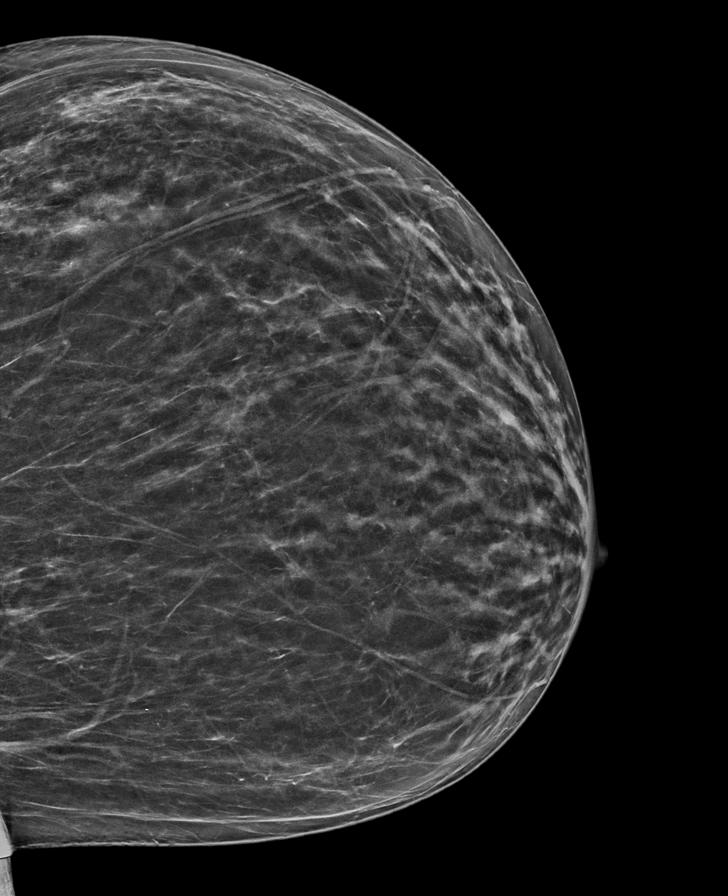

[R MLO synth-2D]
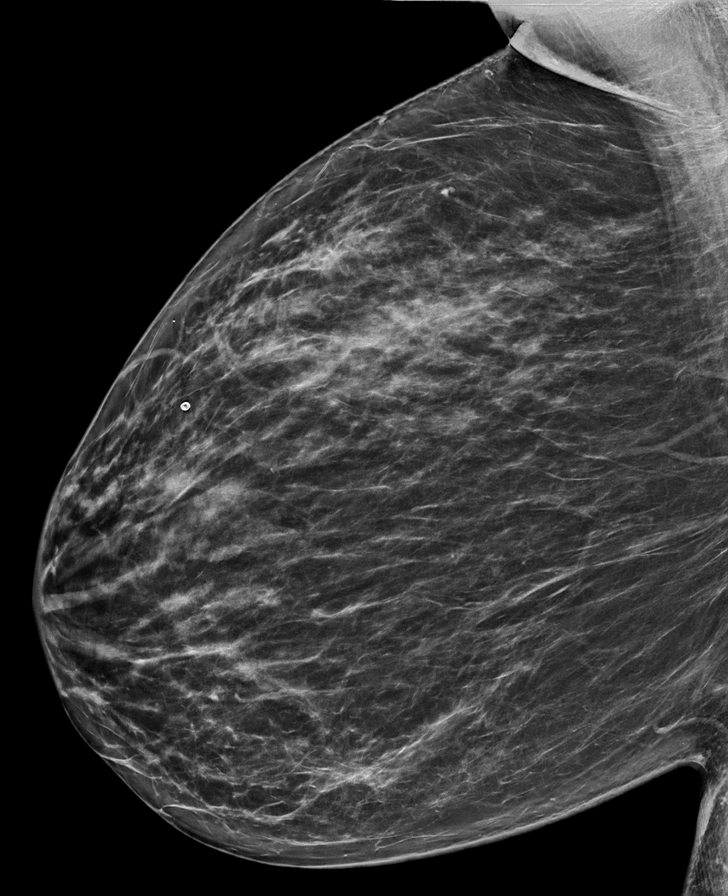

[L MLO synth-2D]
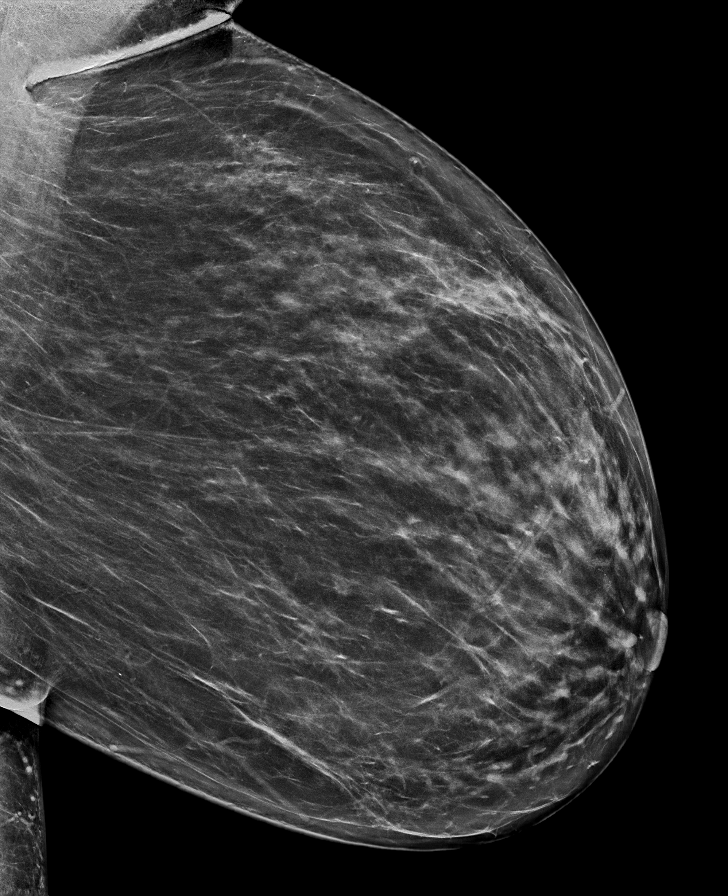

[R CC synth-2D]
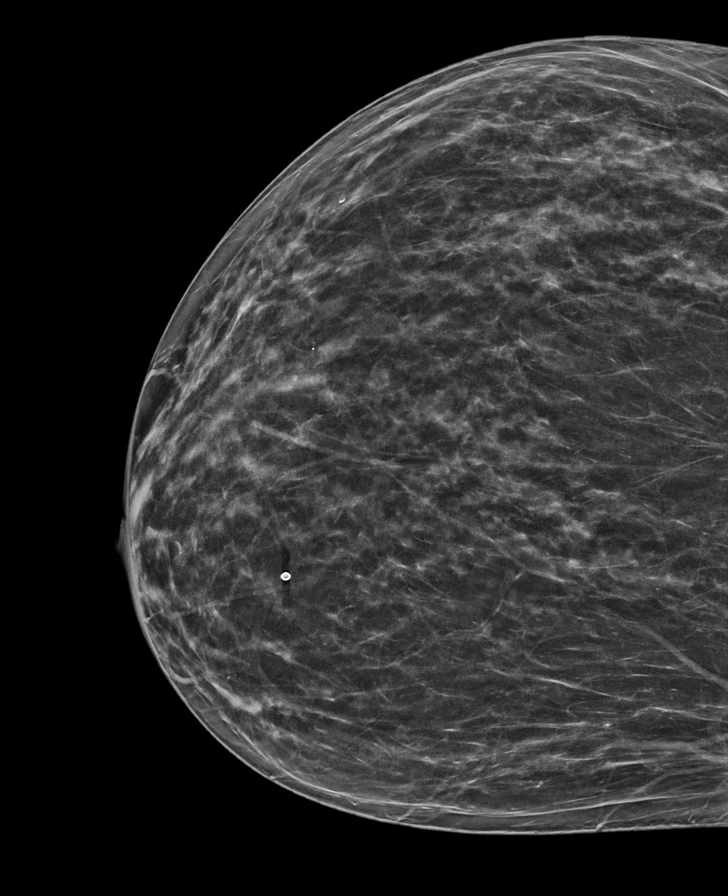

[R CC tomo · tomo slice 36/71.0]
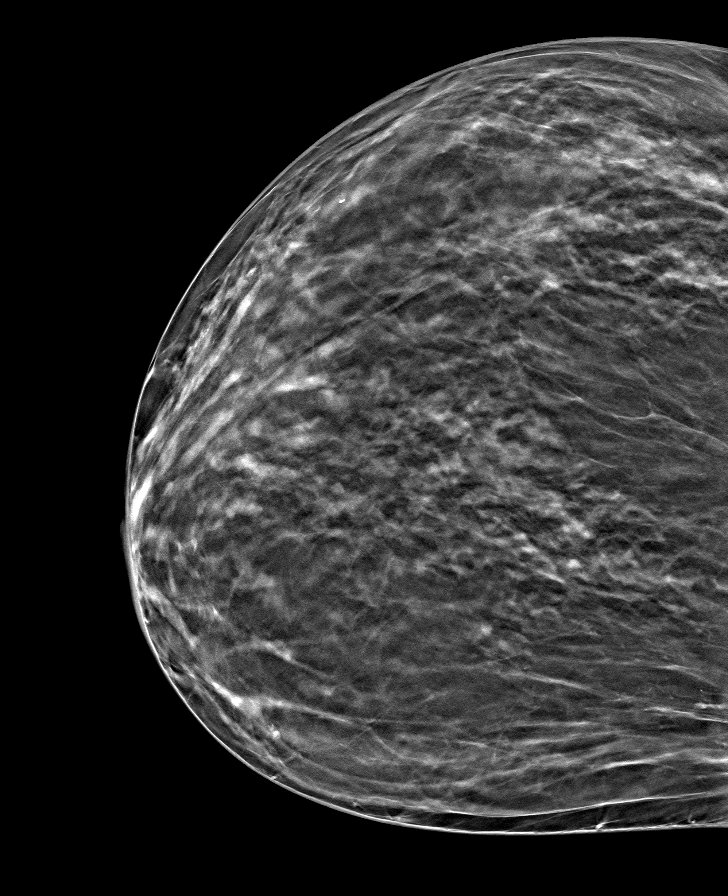

[L CC tomo · tomo slice 36/71.0]
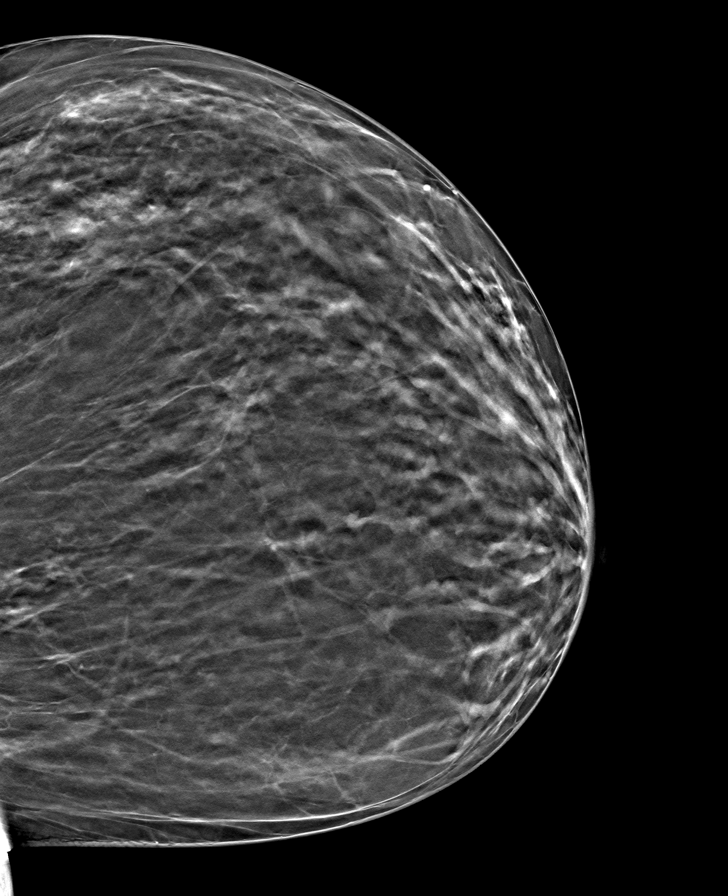

[R MLO tomo · tomo slice 41/80.0]
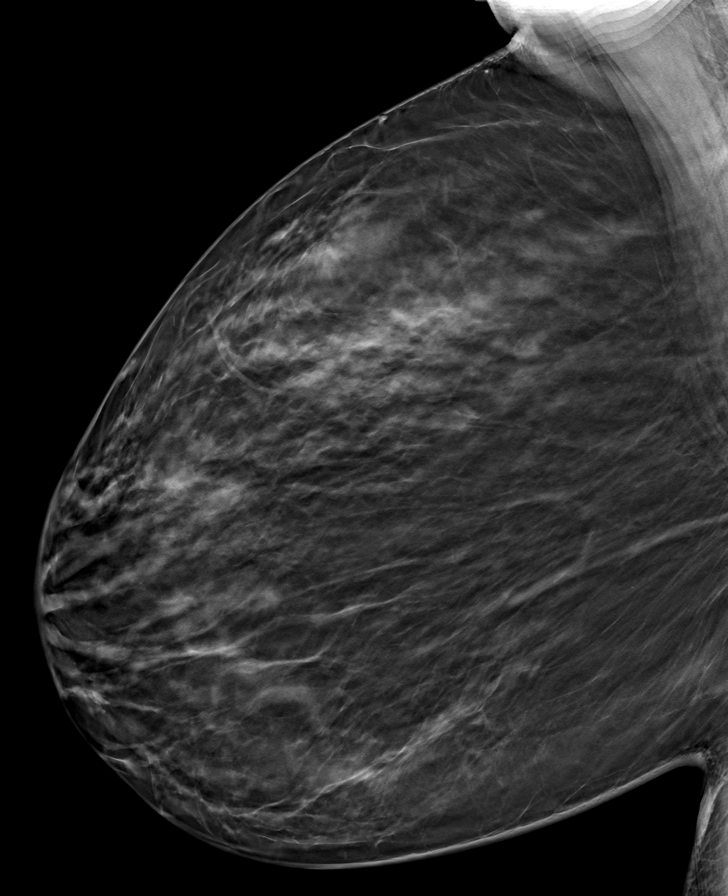

[L MLO tomo · tomo slice 43/84.0]
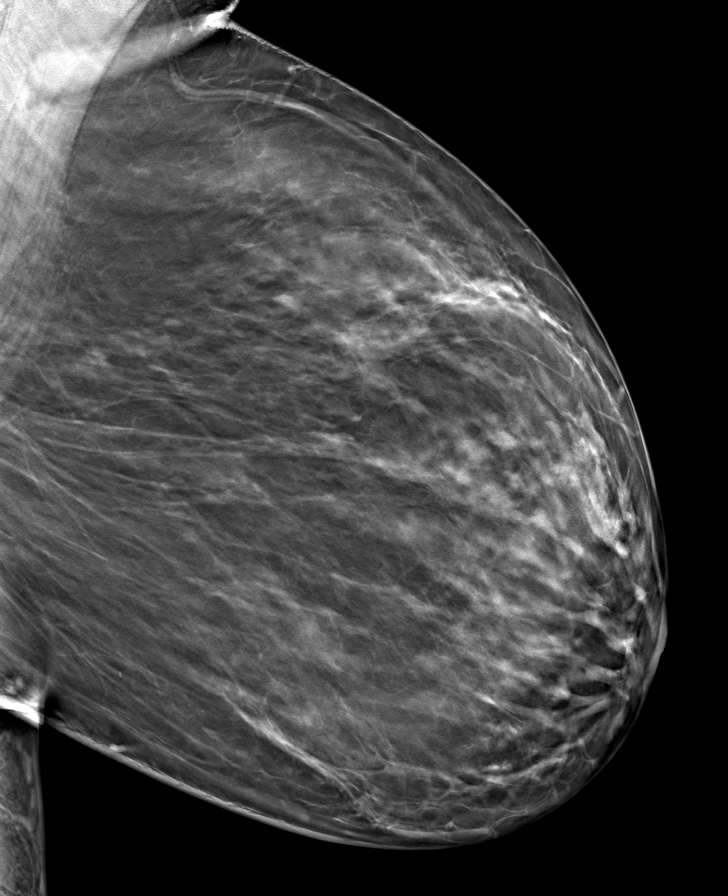

[8 of 24 positions shown; findings below may reference images not displayed]

ACR Breast Density Category b: There are scattered areas of
fibroglandular density.
FINDINGS: There are no findings suspicious for malignancy.
IMPRESSION: No mammographic evidence of malignancy. A result letter of this
screening mammogram will be mailed directly to the patient.

RECOMMENDATION:
Screening mammogram in one year. (Code:51-O-LD2)

BI-RADS CATEGORY  1: Negative.

## 2023-04-21 ENCOUNTER — Ambulatory Visit (INDEPENDENT_AMBULATORY_CARE_PROVIDER_SITE_OTHER): Payer: 59 | Admitting: Podiatry

## 2023-04-21 DIAGNOSIS — B351 Tinea unguium: Secondary | ICD-10-CM | POA: Diagnosis not present

## 2023-04-21 DIAGNOSIS — L84 Corns and callosities: Secondary | ICD-10-CM

## 2023-04-21 DIAGNOSIS — M79675 Pain in left toe(s): Secondary | ICD-10-CM

## 2023-04-21 DIAGNOSIS — E119 Type 2 diabetes mellitus without complications: Secondary | ICD-10-CM

## 2023-04-21 DIAGNOSIS — M79674 Pain in right toe(s): Secondary | ICD-10-CM

## 2023-04-27 ENCOUNTER — Encounter: Payer: Self-pay | Admitting: Podiatry

## 2023-04-27 NOTE — Progress Notes (Signed)
Subjective:  Patient ID: Vanessa Garza, female    DOB: 02/19/1976,  MRN: [id number]  47 y.o. female presents preventative diabetic foot care and callus(es) of both feet and painful thick toenails that are difficult to trim. Painful toenails interfere with ambulation. Aggravating factors include wearing enclosed shoe gear. Pain is relieved with periodic professional debridement. Painful calluses are aggravated when weightbearing with and without shoegear. Pain is relieved with periodic professional debridement.  New problem(s): None   PCP is Mangel, Benison Pap, MD. Theron Arista 02/24/2023.  No Known Allergies  Review of Systems: Negative except as noted in the HPI.   Objective:  Vanessa Garza is a pleasant 47 y.o. female in NAD.Marland Kitchen AAO x 3.  Vascular Examination: Palpable pulses RLE; faintly palpable pedal pulses LLE CFT immediate b/l. Pedal hair present. No edema. No pain with calf compression b/l. Skin temperature gradient WNL b/l. No varicosities noted. No cyanosis or clubbing noted.  Neurological Examination: Sensation grossly intact b/l with 10 gram monofilament. Vibratory sensation intact b/l.  Dermatological Examination: Pedal skin with normal turgor, texture and tone b/l. No open wounds nor interdigital macerations noted. Toenails 1-5 b/l thick, discolored, elongated with subungual debris and pain on dorsal palpation.   Hyperkeratotic lesion(s) bilateral heels.  No erythema, no edema, no drainage, no fluctuance.  Musculoskeletal Examination: Muscle strength 5/5 to b/l LE.  No pain, crepitus noted b/l. No gross pedal deformities. Patient ambulates independently without assistive aids.   Radiographs: None  Last A1c:       No data to display         Assessment:   1. Pain due to onychomycosis of toenails of both feet   2. Heel callus   3. Type 2 diabetes mellitus without complication, without long-term current use of insulin (HCC)    Plan:  -Consent given for treatment as  described below: -Examined patient. -Medicaid ABN signed for services of paring of corn(s)/callus(es)/porokeratos(es) today. Copy in patient chart. -Continue foot and shoe inspections daily. Monitor blood glucose per PCP/Endocrinologist's recommendations. -Continue supportive shoe gear daily. -Mycotic toenails 1-5 bilaterally were debrided in length and girth with sterile nail nippers and dremel without incident. -Callus(es) bilateral heels pared utilizing sterile scalpel blade without complication or incident. Total number debrided =2. -Patient/POA to call should there be question/concern in the interim.  Return in about 3 months (around 07/22/2023).  Freddie Breech, DPM

## 2023-07-28 ENCOUNTER — Ambulatory Visit (INDEPENDENT_AMBULATORY_CARE_PROVIDER_SITE_OTHER): Payer: 59 | Admitting: Podiatry

## 2023-07-28 ENCOUNTER — Encounter: Payer: Self-pay | Admitting: Podiatry

## 2023-07-28 VITALS — Ht 68.0 in | Wt 240.0 lb

## 2023-07-28 DIAGNOSIS — L84 Corns and callosities: Secondary | ICD-10-CM | POA: Diagnosis not present

## 2023-07-28 DIAGNOSIS — M79675 Pain in left toe(s): Secondary | ICD-10-CM

## 2023-07-28 DIAGNOSIS — E119 Type 2 diabetes mellitus without complications: Secondary | ICD-10-CM

## 2023-07-28 DIAGNOSIS — M79674 Pain in right toe(s): Secondary | ICD-10-CM | POA: Diagnosis not present

## 2023-07-28 DIAGNOSIS — B351 Tinea unguium: Secondary | ICD-10-CM

## 2023-08-01 NOTE — Progress Notes (Signed)
ANNUAL DIABETIC FOOT EXAM  Subjective: Vanessa Garza presents today for annual diabetic foot exam.  Chief Complaint  Patient presents with   Nail Problem    Pt is here for [facility] unsure of last A1C PCP is Dr Rosine Beat and LOV was in December.   Patient confirms h/o diabetes.  Patient denies any h/o foot wounds.  Patient has been diagnosed with neuropathy.  Vanessa Garza, Vanessa Garza, Vanessa Garza is patient's PCP.  Past Medical History:  Diagnosis Date   Bipolar 1 disorder (HCC)    Depression    Diabetes mellitus without complication (HCC)    GERD (gastroesophageal reflux disease)    Hyperlipemia    Hypertension    Patient Active Problem List   Diagnosis Date Noted   Heel callus 08/07/2020   Pain due to onychomycosis of toenails of both feet 08/07/2020   Seizure-like activity (HCC) 12/28/2019   Seizures (HCC) 11/24/2019   Type 2 diabetes mellitus without complication (HCC) 11/24/2019   Toxic metabolic encephalopathy 11/24/2019   Essential hypertension 11/24/2019   Hypokalemia 11/24/2019   Bipolar disorder (HCC) 11/24/2019   History reviewed. No pertinent surgical history. Current Outpatient Medications on File Prior to Visit  Medication Sig Dispense Refill   ACCU-CHEK AVIVA PLUS test strip      ammonium lactate (AMLACTIN) 12 % cream Apply 1 Application topically in the morning and at bedtime. 385 g 3   Calcium Citrate-Vitamin D 315-5 MG-MCG TABS Take by mouth.     Cholecalciferol (VITAMIN D-3) 125 MCG (5000 UT) TABS Take 5,000 Units by mouth daily.     citalopram (CELEXA) 20 MG tablet Take 20 mg by mouth daily.     cloZAPine (CLOZARIL) 100 MG tablet Take 450 mg by mouth daily.     famotidine (PEPCID) 40 MG tablet Take by mouth.     FARXIGA 5 MG TABS tablet Take 5 mg by mouth daily.     hydrochlorothiazide (HYDRODIURIL) 25 MG tablet Take 25 mg by mouth daily.     levETIRAcetam (KEPPRA) 500 MG tablet Take 500 mg by mouth 2 (two) times daily.     metFORMIN (GLUCOPHAGE) 500 MG tablet  Take 500 mg by mouth 2 (two) times daily with a meal.      metoprolol tartrate (LOPRESSOR) 25 MG tablet Take 25 mg by mouth 2 (two) times daily.     Multiple Vitamin (MULTI-VITAMIN) tablet Take 1 tablet by mouth daily.     Multiple Vitamins-Minerals (MULTIVITAMIN WITH MINERALS) tablet Take 1 tablet by mouth daily.     mupirocin ointment (BACTROBAN) 2 % Apply 1 application topically daily. With dressing changes 22 g 0   oxybutynin (DITROPAN) 5 MG tablet Take 5 mg by mouth.     pantoprazole (PROTONIX) 40 MG tablet Take 40 mg by mouth daily.     potassium chloride SA (KLOR-CON) 20 MEQ tablet Take 20 mEq by mouth 2 (two) times daily with a meal.      pravastatin (PRAVACHOL) 40 MG tablet Take 40 mg by mouth daily.     triamcinolone cream (KENALOG) 0.1 % Apply 1 application topically 2 (two) times daily as needed. For up to 1 week. Avoid applying to face, groin, and axilla. Use as directed. Long-term use can cause thinning of the skin. 28.4 g 0   UNABLE TO FIND Take 1 tablet by mouth daily. multivitamin     No current facility-administered medications on file prior to visit.    No Known Allergies Social History   Occupational History  Not on file  Tobacco Use   Smoking status: Never   Smokeless tobacco: Never  Vaping Use   Vaping status: Never Used  Substance and Sexual Activity   Alcohol use: Not Currently   Drug use: Not Currently   Sexual activity: Not on file   Family History  Family history unknown: Yes   Immunization History  Administered Date(s) Administered   Rabies, IM 02/14/2019, 02/17/2019, 02/22/2019, 03/01/2019   Tdap 02/14/2019     Review of Systems: Negative except as noted in the HPI.   Objective: There were no vitals filed for this visit.  Vanessa Garza is a pleasant 48 y.o. female in NAD. AAO X 3.  Title   Diabetic Foot Exam - detailed Date & Time: 07/28/2023  3:30 PM Diabetic Foot exam was performed with the following findings: Yes  Visual Foot Exam  completed.: Yes  Is there a history of foot ulcer?: No Is there a foot ulcer now?: No Is there swelling?: No Is there elevated skin temperature?: No Is there abnormal foot shape?: No Is there a claw toe deformity?: No Are the toenails long?: Yes Are the toenails thick?: Yes Are the toenails ingrown?: No Is the skin thin, fragile, shiny and hairless?": No Normal Range of Motion?: Yes Is there foot or ankle muscle weakness?: No Do you have pain in calf while walking?: No Are the shoes appropriate in style and fit?: Yes Can the patient see the bottom of their feet?: No Pulse Foot Exam completed.: Yes   Right Posterior Tibialis: Present Left posterior Tibialis: Diminished   Right Dorsalis Pedis: Present Left Dorsalis Pedis: Diminished     Sensory Foot Exam Completed.: Yes Semmes-Weinstein Monofilament Test "+" means "has sensation" and "-" means "no sensation"   R Site 1-Great Toe: Pos L Site 1-Great Toe: Pos   R Site 4: Pos L Site 4: Pos   R site 5: Pos L Site 5: Pos  R Site 6: Pos L Site 6: Pos     Image components are not supported.   Image components are not supported. Image components are not supported.  [place] Right vibratory: present Left vibratory: present  Comments Hyperkeratotic lesion(s) bilateral heels.  No erythema, no edema, no drainage, no fluctuance.      ADA Risk Categorization: Low Risk :  Patient has all of the following: Intact protective sensation No prior foot ulcer  No severe deformity Pedal pulses present  Assessment: 1. Pain due to onychomycosis of toenails of both feet   2. Callus   3. Type 2 diabetes mellitus without complication, without long-term current use of insulin (HCC)   4. Encounter for diabetic foot exam [facility]     Plan: -Patient was evaluated today. All questions/concerns addressed on today's visit. -Caregiver/provider present with patient on today's visit. -Diabetic foot examination performed today. -Continue diabetic  foot care principles: inspect feet daily, monitor glucose as recommended by PCP and/or Endocrinologist, and follow prescribed diet per PCP, Endocrinologist and/or dietician. -Continue supportive shoe gear daily. -Toenails 1-5 b/l were debrided in length and girth with sterile nail nippers and dremel without iatrogenic bleeding.  -Callus(es) bilateral heels pared utilizing sterile scalpel blade without complication or incident. Total number debrided =2. -Patient/POA to call should there be question/concern in the interim. Return in about 3 months (around 10/26/2023).  Freddie Breech, DPM      [facility] LOCATION: [address]  [place], [place] [address]                   Office [phone]   [facility] LOCATION: [address] [place], [place] [address] Office [phone]

## 2023-10-30 ENCOUNTER — Encounter: Payer: Self-pay | Admitting: Podiatry

## 2023-10-30 ENCOUNTER — Ambulatory Visit (INDEPENDENT_AMBULATORY_CARE_PROVIDER_SITE_OTHER): Payer: 59 | Admitting: Podiatry

## 2023-10-30 DIAGNOSIS — B351 Tinea unguium: Secondary | ICD-10-CM

## 2023-10-30 DIAGNOSIS — M79675 Pain in left toe(s): Secondary | ICD-10-CM

## 2023-10-30 DIAGNOSIS — L84 Corns and callosities: Secondary | ICD-10-CM

## 2023-10-30 DIAGNOSIS — M79674 Pain in right toe(s): Secondary | ICD-10-CM

## 2023-10-30 DIAGNOSIS — E119 Type 2 diabetes mellitus without complications: Secondary | ICD-10-CM

## 2023-11-06 NOTE — Progress Notes (Signed)
  Subjective:  Patient ID: Vanessa Garza, female    DOB: 03/05/1976,  MRN: [id number]  Vanessa Garza presents to clinic today for preventative diabetic foot care and callus(es) of both feet and painful mycotic toenails that are difficult to trim. Painful toenails interfere with ambulation. Aggravating factors include wearing enclosed shoe gear. Pain is relieved with periodic professional debridement. Painful calluses are aggravated when weightbearing with and without shoegear. Pain is relieved with periodic professional debridement.  Chief Complaint  Patient presents with   Diabetes    "Check my feet." Dr. Calton Catholic - 10/22/2023: A1c - 5.5   New problem(s): None.   PCP is Mangel, Benison Pap, MD.  No Known Allergies  Review of Systems: Negative except as noted in the HPI.  Objective: No changes noted in today's physical examination. There were no vitals filed for this visit. Vanessa Garza is a pleasant 48 y.o. female in NAD. AAO x 3.  Vascular Examination: CFT less than 3 seconds. DP/PT pulses palpable RLE;  nonpalpable DP/PT LLE b/l. Digital hair absent. Skin temperature gradient warm to warn b/l. No ischemia or gangrene. No cyanosis or clubbing noted b/l.    Neurological Examination: Sensation grossly intact b/l with 10 gram monofilament. Vibratory sensation intact b/l.   Dermatological Examination: Pedal skin thin, shiny and atrophic b/l. No open wounds. No interdigital macerations.   Toenails 1-5 b/l thick, discolored, elongated with subungual debris and pain on dorsal palpation.   Hyperkeratotic lesion(s) bilateral heels.  No erythema, no edema, no drainage, no fluctuance.  Musculoskeletal Examination: Muscle strength 5/5 to all lower extremity muscle groups bilaterally. No pain, crepitus or joint limitation noted with ROM bilateral LE. No gross bony deformities bilaterally.  Radiographs: None  Assessment/Plan: 1. Pain due to onychomycosis of toenails of both feet    2. Callus   3. Type 2 diabetes mellitus without complication, without long-term current use of insulin  (HCC)     -Consent given for treatment as described below: -Examined patient. -Patient to continue soft, supportive shoe gear daily. -Mycotic toenails 1-5 bilaterally were debrided in length and girth with sterile nail nippers and dremel without incident. -Callus(es) bilateral heels pared utilizing sterile scalpel blade without complication or incident. Total number debrided =2. Recommended patient place pillows between heels when in bed. -Patient/POA to call should there be question/concern in the interim.   Return in about 3 months (around 01/30/2024).  Luella Sager, DPM      [facility] LOCATION: [address] [place] [address]                   Office [phone]   [facility] LOCATION: [address] [place], [place] [address] Office [phone]

## 2024-02-05 ENCOUNTER — Ambulatory Visit: Admitting: Podiatry

## 2024-02-05 ENCOUNTER — Encounter: Payer: Self-pay | Admitting: Podiatry

## 2024-02-05 DIAGNOSIS — B351 Tinea unguium: Secondary | ICD-10-CM | POA: Diagnosis not present

## 2024-02-05 DIAGNOSIS — E119 Type 2 diabetes mellitus without complications: Secondary | ICD-10-CM | POA: Diagnosis not present

## 2024-02-05 DIAGNOSIS — M79674 Pain in right toe(s): Secondary | ICD-10-CM

## 2024-02-05 DIAGNOSIS — M79675 Pain in left toe(s): Secondary | ICD-10-CM | POA: Diagnosis not present

## 2024-02-05 DIAGNOSIS — L84 Corns and callosities: Secondary | ICD-10-CM

## 2024-02-09 ENCOUNTER — Encounter: Payer: Self-pay | Admitting: Podiatry

## 2024-02-09 NOTE — Progress Notes (Signed)
  Subjective:  Patient ID: Vanessa Garza, female    DOB: Apr 16, 1976,  MRN: [id number]  Vanessa Garza presents to clinic today for preventative diabetic foot care and callus(es) bilateral heels and painful thick toenails that are difficult to trim. Painful toenails interfere with ambulation. Aggravating factors include wearing enclosed shoe gear. Pain is relieved with periodic professional debridement. Painful calluses are aggravated when weightbearing with and without shoegear. Pain is relieved with periodic professional debridement. She is a resident of [facility] and is accompanied by caregiver, Charolette, on today's visit.  New problem(s): None.   PCP is Mangel, Benison Pap, MD. Vanessa Garza 01/13/2024.  No Known Allergies  Review of Systems: Negative except as noted in the HPI.  Objective: No changes noted in today's physical examination. There were no vitals filed for this visit. Vanessa Garza is a pleasant 48 y.o. female obese in NAD. AAO x 3.  Vascular Examination: Capillary refill time immediate b/l. Palpable pluses RLE; diminished pulses LLE. Pedal hair present b/l. Pedal edema absent. No pain with calf compression b/l. Skin temperature gradient WNL b/l. No cyanosis or clubbing b/l. No ischemia or gangrene noted b/l. Trace edema noted BLE.  Neurological Examination: Sensation grossly intact b/l with 10 gram monofilament. Vibratory sensation intact b/l.   Dermatological Examination: Pedal skin with normal turgor, texture and tone b/l.  No open wounds. No interdigital macerations.   Toenails 1-5 b/l thick, discolored, elongated with subungual debris and pain on dorsal palpation.   Hyperkeratotic lesion(s) posteromedial aspect of left heel and posteromedial aspect of right heel.  No erythema, no edema, no drainage, no fluctuance.  Musculoskeletal Examination: Normal muscle strength 5/5 to all lower extremity muscle groups bilaterally. Pes planus deformity noted bilateral LE.Vanessa Garza No  pain, crepitus or joint limitation noted with ROM b/l LE.  Patient ambulates independently without assistive aids.  Radiographs: None  Assessment/Plan: 1. Pain due to onychomycosis of toenails of both feet   2. Callus   3. Type 2 diabetes mellitus without complication, without long-term current use of insulin  [facility])     Patient was evaluated and treated. All patient's and/or POA's questions/concerns addressed on today's visit. Toenails 1-5 debrided in length and girth without incident. Callus(es) posteromedial aspect of both heels pared with sharp debridement without incident. Continue daily foot inspections and monitor blood glucose per PCP/Endocrinologist's recommendations. Continue soft, supportive shoe gear daily. Report any pedal injuries to medical professional. Call office if there are any questions/concerns.  Return in about 3 months (around 05/07/2024).  Vanessa Garza, DPM      [facility] LOCATION: [address] [place] [address]                   Office [phone]   [facility] LOCATION: [address] [place], [place] [address] Office [phone]

## 2024-06-14 ENCOUNTER — Ambulatory Visit (INDEPENDENT_AMBULATORY_CARE_PROVIDER_SITE_OTHER): Admitting: Podiatry

## 2024-06-14 ENCOUNTER — Encounter: Payer: Self-pay | Admitting: Podiatry

## 2024-06-14 DIAGNOSIS — E119 Type 2 diabetes mellitus without complications: Secondary | ICD-10-CM

## 2024-06-14 DIAGNOSIS — L84 Corns and callosities: Secondary | ICD-10-CM | POA: Diagnosis not present

## 2024-06-14 DIAGNOSIS — B351 Tinea unguium: Secondary | ICD-10-CM

## 2024-06-14 DIAGNOSIS — M79674 Pain in right toe(s): Secondary | ICD-10-CM

## 2024-06-14 DIAGNOSIS — M79675 Pain in left toe(s): Secondary | ICD-10-CM | POA: Diagnosis not present

## 2024-06-14 NOTE — Patient Instructions (Signed)
 To [facility]: Resident Vanessa Garza. Vanessa Garza was seen for diabetic foot care. Toenails debrided x 10 without incident. Heel calluses debrided bilateral heels x 3: posteromedial heel bilaterally and posterolateral aspect of left heel.  Accompanied by [facility] staff member, [place].  Follow up 3 months.

## 2024-06-18 ENCOUNTER — Ambulatory Visit: Admitting: Podiatry

## 2024-06-20 NOTE — Progress Notes (Signed)
"  °  Subjective:  Patient ID: Vanessa Garza, female    DOB: [birth year],  MRN: [id number]  Vanessa Garza presents to clinic today for preventative diabetic foot care and callus(es) of both feet and painful thick toenails that are difficult to trim. Painful toenails interfere with ambulation. Aggravating factors include wearing enclosed shoe gear. Pain is relieved with periodic professional debridement. Painful calluses are aggravated when weightbearing with and without shoegear. Pain is relieved with periodic professional debridement. She is a resident of [facility] and [place]. Chief Complaint  Patient presents with   Nail Problem    She saw Dr. Keven in July. A1c 5.9   New problem(s): None.   PCP is Mangel, Benison Pap, MD.  Allergies[1]  Review of Systems: Negative except as noted in the HPI.  Objective: No changes noted in today's physical examination. There were no vitals filed for this visit. Vanessa Garza is a pleasant 48 y.o. female in NAD. AAO x 3.  Vascular Examination: Capillary refill time immediate b/l. Palpable pluses RLE; diminished pulses LLE. Pedal hair present b/l. Pedal edema absent. No pain with calf compression b/l. Skin temperature gradient WNL b/l. No cyanosis or clubbing b/l. No ischemia or gangrene noted b/l. Trace edema noted BLE.  Neurological Examination: Sensation grossly intact b/l with 10 gram monofilament. Vibratory sensation intact b/l.   Dermatological Examination: Pedal skin with normal turgor, texture and tone b/l.  No open wounds. No interdigital macerations.   Toenails 1-5 b/l thick, discolored, elongated with subungual debris and pain on dorsal palpation.   Hyperkeratotic lesion(s) posteromedial/posterolateral aspect of left heel and posteromedial aspect of right heel.  No erythema, no edema, no drainage, no fluctuance.  Musculoskeletal Examination: Normal muscle strength 5/5 to all lower extremity muscle  groups bilaterally. Pes planus deformity noted bilateral LE.Vanessa Garza No pain, crepitus or joint limitation noted with ROM b/l LE.  Patient ambulates independently without assistive aids.  Radiographs: None  Assessment/Plan: 1. Pain due to onychomycosis of toenails of both feet   2. Callus   3. Type 2 diabetes mellitus without complication, without long-term current use of insulin  [facility])   Consent given for treatment. Patient examined. All patient's and/or POA's questions/concerns addressed on today's visit. Mycotic toenails 1-5 b/l  debrided in length and girth without incident. Callus(es) bilateral heels x 3 pared with sharp debridement without incident. Continue foot and shoe inspections daily. Monitor blood glucose per PCP/Endocrinologist's recommendations.Continue soft, supportive shoe gear daily. Report any pedal injuries to medical professional. Call office if there are any quesitons/concerns.  Return in about 3 months (around 09/12/2024).  Vanessa Garza, DPM      [facility] LOCATION: [address], [place] [address]                   Office [phone]   [facility] LOCATION: [address] [place], [place] [address] Office [phone]     [1] No Known Allergies  "

## 2024-09-13 ENCOUNTER — Ambulatory Visit: Admitting: Podiatry

## 2024-09-16 ENCOUNTER — Ambulatory Visit: Admitting: Podiatry
